# Patient Record
Sex: Female | Born: 1990 | Hispanic: No | Marital: Married | Smoking: Never smoker
Health system: Southern US, Community
[De-identification: ages and names within clinical notes are randomized; demographics above are authoritative.]

## PROBLEM LIST (undated history)

## (undated) ENCOUNTER — Inpatient Hospital Stay (HOSPITAL_COMMUNITY): Payer: Self-pay

## (undated) DIAGNOSIS — J45909 Unspecified asthma, uncomplicated: Secondary | ICD-10-CM

## (undated) HISTORY — DX: Unspecified asthma, uncomplicated: J45.909

---

## 2015-06-16 DIAGNOSIS — O321XX0 Maternal care for breech presentation, not applicable or unspecified: Secondary | ICD-10-CM

## 2017-09-27 NOTE — L&D Delivery Note (Signed)
Patient is 27 y.o. [id number] 4473w1d admitted for IOL for polyhydramnios. S/p IOL with foley bulb followed by Pitocin. AROM at 2256.  Prenatal course also complicated by h/o prior C/S for breech, polyhydramnios, bilateral UTD, hx of asthma.  Delivery Note At 1:57 AM a viable female was delivered via Vaginal, Spontaneous (Presentation: ROA ).  APGAR: 8, 9; weight  pending.   Placenta status: spontaneous, intact .  Cord:  3 vessel  Anesthesia:  Epidural  Episiotomy: None Lacerations: 1st degree;Periurethral Suture Repair: 3.0 vicryl, 4.0 monocryl Est. Blood Loss (mL):  600 cc   Head delivered ROA. No nuchal cord present. Shoulder and body delivered in usual fashion. Infant with spontaneous cry, placed on mother's abdomen, dried and bulb suctioned. Cord clamped x 2 after 1-minute delay, and cut by family member. Cord blood drawn. Placenta delivered spontaneously with gentle cord traction. Fundus firm with massage and Pitocin. Perineum inspected and found to have 1st degree and periurethral laceration, which was found to be hemostatic. Which was repaired with 3.0 vicryl and 4.0 monocryl with good hemostasis achieved.   Mom to postpartum.  Baby to Couplet care / Skin to Skin.  Oralia ManisSherin Kayn Haymore, DO PGY-1 10/18/2017, 2:41 AM

## 2017-10-11 ENCOUNTER — Other Ambulatory Visit: Payer: Self-pay

## 2017-10-11 ENCOUNTER — Encounter (HOSPITAL_COMMUNITY): Payer: Self-pay | Admitting: *Deleted

## 2017-10-11 ENCOUNTER — Inpatient Hospital Stay (HOSPITAL_COMMUNITY)
Admission: AD | Admit: 2017-10-11 | Discharge: 2017-10-11 | Disposition: A | Discharge status: Home / Self Care | Payer: Medicaid Other | Source: Ambulatory Visit | Attending: Obstetrics and Gynecology | Admitting: Obstetrics and Gynecology

## 2017-10-11 DIAGNOSIS — O26893 Other specified pregnancy related conditions, third trimester: Secondary | ICD-10-CM | POA: Diagnosis present

## 2017-10-11 DIAGNOSIS — Z3A38 38 weeks gestation of pregnancy: Secondary | ICD-10-CM | POA: Diagnosis not present

## 2017-10-11 DIAGNOSIS — O0933 Supervision of pregnancy with insufficient antenatal care, third trimester: Secondary | ICD-10-CM | POA: Diagnosis not present

## 2017-10-11 LAB — URINALYSIS, ROUTINE W REFLEX MICROSCOPIC
BILIRUBIN URINE: NEGATIVE
Glucose, UA: NEGATIVE mg/dL
HGB URINE DIPSTICK: NEGATIVE
Ketones, ur: NEGATIVE mg/dL
Nitrite: NEGATIVE
PH: 5 (ref 5.0–8.0)
Protein, ur: 30 mg/dL — AB
SPECIFIC GRAVITY, URINE: 1.027 (ref 1.005–1.030)

## 2017-10-11 NOTE — MAU Note (Signed)
Pt presents for a "check-up" , states she got her care in [place] and just got here one week ago. Reports they called here yesterday and were told to come today. Also reports cold symptoms

## 2017-10-11 NOTE — Discharge Instructions (Signed)
Safe Medications in Pregnancy   Acne: Benzoyl Peroxide Salicylic Acid  Backache/Headache: Tylenol: 2 regular strength every 4 hours OR              2 Extra strength every 6 hours  Colds/Coughs/Allergies: Benadryl (alcohol free) 25 mg every 6 hours as needed Breath right strips Claritin Cepacol throat lozenges Chloraseptic throat spray Cold-Eeze- up to three times per day Cough drops, alcohol free Flonase (by prescription only) Guaifenesin Mucinex Robitussin DM (plain only, alcohol free) Saline nasal spray/drops Sudafed (pseudoephedrine) & Actifed ** use only after [redacted] weeks gestation and if you do not have high blood pressure Tylenol Vicks Vaporub Zinc lozenges Zyrtec   Constipation: Colace Ducolax suppositories Fleet enema Glycerin suppositories Metamucil Milk of magnesia Miralax Senokot Smooth move tea  Diarrhea: Kaopectate Imodium A-D  *NO pepto Bismol  Hemorrhoids: Anusol Anusol HC Preparation H Tucks  Indigestion: Tums Maalox Mylanta Zantac  Pepcid  Insomnia: Benadryl (alcohol free) 25mg  every 6 hours as needed Tylenol PM Unisom, no Gelcaps  Leg Cramps: Tums MagGel  Nausea/Vomiting:  Bonine Dramamine Emetrol Ginger extract Sea bands Meclizine  Nausea medication to take during pregnancy:  Unisom (doxylamine succinate 25 mg tablets) Take one tablet daily at bedtime. If symptoms are not adequately controlled, the dose can be increased to a maximum recommended dose of two tablets daily (1/2 tablet in the morning, 1/2 tablet mid-afternoon and one at bedtime). Vitamin B6 100mg  tablets. Take one tablet twice a day (up to 200 mg per day).  Skin Rashes: Aveeno products Benadryl cream or 25mg  every 6 hours as needed Calamine Lotion 1% cortisone cream  Yeast infection: Gyne-lotrimin 7 Monistat 7   **If taking multiple medications, please check labels to avoid duplicating the same active ingredients **take medication as directed on  the label ** Do not exceed 4000 mg of tylenol in 24 hours **Do not take medications that contain aspirin or ibuprofen    Vaginal Birth After Cesarean Delivery Vaginal birth after cesarean delivery (VBAC) is giving birth vaginally after previously delivering a baby by a cesarean. In the past, if a woman had a cesarean delivery, all births afterward would be done by cesarean delivery. This is no longer true. It can be safe for the mother to try a vaginal delivery after having a cesarean delivery. It is important to discuss VBAC with your health care provider early in the pregnancy so you can understand the risks, benefits, and options. It will give you time to decide what is best in your particular case. The final decision about whether to have a VBAC or repeat cesarean delivery should be between you and your health care provider. Any changes in your health or your babys health during your pregnancy may make it necessary to change your initial decision about VBAC. Women who plan to have a VBAC should check with their health care provider to be sure that:  The previous cesarean delivery was done with a low transverse uterine cut (incision) (not a vertical classical incision).  The birth canal is big enough for the baby.  There were no other operations on the uterus.  An electronic fetal monitor (EFM) will be on at all times during labor.  An operating room will be available and ready in case an emergency cesarean delivery is needed.  A health care provider and surgical nursing staff will be available at all times during labor to be ready to do an emergency delivery cesarean if necessary.  An anesthesiologist will be present in  case an emergency cesarean delivery is needed.  The nursery is prepared and has adequate personnel and necessary equipment available to care for the baby in case of an emergency cesarean delivery. Benefits of VBAC  Shorter stay in the hospital.  Avoidance of risks  associated with cesarean delivery, such as: ? Surgical complications, such as opening of the incision or hernia in the incision. ? Injury to other organs. ? Fever. This can occur if an infection develops after surgery. It can also occur as a reaction to the medicine given to make you numb during the surgery.  Less blood loss and need for blood transfusions.  Lower risk of blood clots and infection.  Shorter recovery.  Decreased risk for having to remove the uterus (hysterectomy).  Decreased risk for the placenta to completely or partially cover the opening of the uterus (placenta previa) with a future pregnancy.  Decrease risk in future labor and delivery. Risks of a VBAC  Tearing (rupture) of the uterus. This is occurs in less than 1% of VBACs. The risk of this happening is higher if: ? Steps are taken to begin the labor process (induce labor) or stimulate or strengthen contractions (augment labor). ? Medicine is used to soften (ripen) the cervix.  Having to remove the uterus (hysterectomy) if it ruptures. VBAC should not be done if:  The previous cesarean delivery was done with a vertical (classical) or T-shaped incision or you do not know what kind of incision was made.  You had a ruptured uterus.  You have had certain types of surgery on your uterus, such as removal of uterine fibroids. Ask your health care provider about other types of surgeries that prevent you from having a VBAC.  You have certain medical or childbirth (obstetrical) problems.  There are problems with the baby.  You have had two previous cesarean deliveries and no vaginal deliveries. Other facts to know about VBAC:  It is safe to have an epidural anesthetic with VBAC.  It is safe to turn the baby from a breech position (attempt an external cephalic version).  It is safe to try a VBAC with twins.  VBAC may not be successful if your baby weights 8.8 lb (4 kg) or more. However, weight predictions are  not always accurate and should not be used alone to decide if VBAC is right for you.  There is an increased failure rate if the time between the cesarean delivery and VBAC is less than 19 months.  Your health care provider may advise against a VBAC if you have preeclampsia (high blood pressure, protein in the urine, and swelling of face and extremities).  VBAC is often successful if you previously gave birth vaginally.  VBAC is often successful when the labor starts spontaneously before the due date.  Delivering a baby through a VBAC is similar to having a normal spontaneous vaginal delivery. This information is not intended to replace advice given to you by your health care provider. Make sure you discuss any questions you have with your health care provider. Document Released: 03/06/2007 Document Revised: 02/19/2016 Document Reviewed: 04/12/2013 Elsevier Interactive Patient Education  [employer]2018 [employer] Inc[employer]

## 2017-10-11 NOTE — MAU Provider Note (Signed)
History     CSN: [id number]  Arrival date and time: 10/11/17 [id number]   First Provider Initiated Contact with Patient 10/11/17 1142      Chief Complaint  Patient presents with  . Prenatal visit   HPI Jacqueline Macdonald is a 27 y.o. G3P1011 at 7036w4d who presents requesting a "check up." She moved here from [place] 10 days ago and wants to establish prenatal care here. She denies any abdominal pain, vaginal bleeding or discharge. She reports good fetal movement. She denies any problems during this pregnancy and brought some records from her prenatal care with her. Her first pregnancy was delivered by c/s due to breech presentation and she desires a TOLAC.   OB History    Gravida Para Term Preterm AB Living   3 1 1   1 1    SAB TAB Ectopic Multiple Live Births     1     1      History reviewed. No pertinent past medical history.  Past Surgical History:  Procedure Laterality Date  . CESAREAN SECTION      History reviewed. No pertinent family history.  Social History   Tobacco Use  . Smoking status: Never Smoker  . Smokeless tobacco: Never Used  Substance Use Topics  . Alcohol use: No    Frequency: Never  . Drug use: No    Allergies: No Known Allergies  Medications Prior to Admission  Medication Sig Dispense Refill Last Dose  . IRON PO Take 1 tablet by mouth daily. Pt not sure of dosage and not sure info for pharmacy filled   10/11/2017 at Unknown time  . Prenatal Vit-Fe Fumarate-FA (PRENATAL MULTIVITAMIN) TABS tablet Take 1 tablet by mouth daily at 12 noon.   10/10/2017 at Unknown time    Review of Systems  Constitutional: Negative.  Negative for fatigue and fever.  HENT: Negative.   Respiratory: Negative.  Negative for shortness of breath.   Cardiovascular: Negative.  Negative for chest pain.  Gastrointestinal: Negative.  Negative for abdominal pain, constipation, diarrhea, nausea and vomiting.  Genitourinary: Negative.  Negative for dysuria, vaginal bleeding and vaginal  discharge.  Neurological: Negative.  Negative for dizziness and headaches.   Physical Exam   Blood pressure 128/82, pulse 98, temperature 98.1 F (36.7 C), temperature source Oral, resp. rate 16, height 5\' 3"  (1.6 m), weight 208 lb (94.3 kg), SpO2 98 %.  Physical Exam  Nursing note and vitals reviewed. Constitutional: She is oriented to person, place, and time. She appears well-developed and well-nourished. No distress.  HENT:  Head: Normocephalic.  Eyes: Pupils are equal, round, and reactive to light.  Cardiovascular: Normal rate, regular rhythm and normal heart sounds.  Respiratory: Effort normal and breath sounds normal. No respiratory distress.  GI: Soft. Bowel sounds are normal. She exhibits no distension. There is no tenderness.  Neurological: She is alert and oriented to person, place, and time.  Skin: Skin is warm and dry.  Psychiatric: She has a normal mood and affect. Her behavior is normal. Judgment and thought content normal.   Fetal Tracing:  Baseline: 140 Variability: moderate Accels: 15x15 Decels: none  Toco: occasional uc's   MAU Course  Procedures Results for orders placed or performed during the hospital encounter of 10/11/17 (from the past 24 hour(s))  Urinalysis, Routine w reflex microscopic     Status: Abnormal   Collection Time: 10/11/17 10:26 AM  Result Value Ref Range   Color, Urine YELLOW YELLOW   APPearance CLOUDY (A) CLEAR  Specific Gravity, Urine 1.027 1.005 - 1.030   pH 5.0 5.0 - 8.0   Glucose, UA NEGATIVE NEGATIVE mg/dL   Hgb urine dipstick NEGATIVE NEGATIVE   Bilirubin Urine NEGATIVE NEGATIVE   Ketones, ur NEGATIVE NEGATIVE mg/dL   Protein, ur 30 (A) NEGATIVE mg/dL   Nitrite NEGATIVE NEGATIVE   Leukocytes, UA MODERATE (A) NEGATIVE   RBC / HPF 0-5 0 - 5 RBC/hpf   WBC, UA 6-30 0 - 5 WBC/hpf   Bacteria, UA MANY (A) NONE SEEN   Squamous Epithelial / LPF TOO NUMEROUS TO COUNT (A) NONE SEEN   Mucus PRESENT    Non Squamous Epithelial 0-5  (A) NONE SEEN   MDM UA NST Vertex presentation confirmed by bedside u/s by L. Leftwich-Kirby CNM Assessment and Plan   1. [redacted] weeks gestation of pregnancy   2. Late prenatal care affecting pregnancy in third trimester    -Discharge home in stable condition -Labor precautions discussed -Patient advised to follow-up with [facility] tomorrow to establish prenatal care -Patient may return to MAU as needed or if her condition were to change or worsen  Rolm Bookbinder CNM 10/11/2017, 12:12 PM

## 2017-10-12 ENCOUNTER — Ambulatory Visit (INDEPENDENT_AMBULATORY_CARE_PROVIDER_SITE_OTHER): Payer: Medicaid Other | Admitting: Obstetrics and Gynecology

## 2017-10-12 ENCOUNTER — Encounter: Payer: Self-pay | Admitting: Obstetrics and Gynecology

## 2017-10-12 VITALS — BP 132/68 | HR 86 | Wt 205.0 lb

## 2017-10-12 DIAGNOSIS — Z113 Encounter for screening for infections with a predominantly sexual mode of transmission: Secondary | ICD-10-CM

## 2017-10-12 DIAGNOSIS — Z98891 History of uterine scar from previous surgery: Secondary | ICD-10-CM | POA: Diagnosis not present

## 2017-10-12 DIAGNOSIS — J45909 Unspecified asthma, uncomplicated: Secondary | ICD-10-CM

## 2017-10-12 DIAGNOSIS — Z789 Other specified health status: Secondary | ICD-10-CM

## 2017-10-12 DIAGNOSIS — Z3403 Encounter for supervision of normal first pregnancy, third trimester: Secondary | ICD-10-CM | POA: Diagnosis present

## 2017-10-12 DIAGNOSIS — Z758 Other problems related to medical facilities and other health care: Secondary | ICD-10-CM | POA: Insufficient documentation

## 2017-10-12 DIAGNOSIS — Z603 Acculturation difficulty: Secondary | ICD-10-CM

## 2017-10-12 LAB — POCT URINALYSIS DIP (DEVICE)
Bilirubin Urine: NEGATIVE
Glucose, UA: NEGATIVE mg/dL
Nitrite: NEGATIVE
PH: 6 (ref 5.0–8.0)
PROTEIN: NEGATIVE mg/dL
SPECIFIC GRAVITY, URINE: 1.02 (ref 1.005–1.030)
Urobilinogen, UA: 0.2 mg/dL (ref 0.0–1.0)

## 2017-10-12 LAB — OB RESULTS CONSOLE GBS: GBS: NEGATIVE

## 2017-10-12 NOTE — Progress Notes (Signed)
Presentation (vertex) confirmed by bedside US today.

## 2017-10-12 NOTE — Progress Notes (Signed)
INITIAL PRENATAL VISIT NOTE  Subjective:  Jacqueline Macdonald is a 27 y.o. G3P1011 at [redacted]w[redacted]d by 27 week [place] being seen today for her initial prenatal visit. They arrived from [place] this week and she brought a couple of ultrasounds with her. She and partner are happy with the pregnancy. She has an obstetric history significant for CS for breech position, reports bleeding and partial separation of placenta at 6 months with full term delivery. She has a medical history significant for asthma.   Patient reports occasional contractions.  Contractions: Irritability. Vag. Bleeding: None.  Movement: Present. Denies leaking of fluid.   Past Medical History:  Diagnosis Date  . Asthma    Past Surgical History:  Procedure Laterality Date  . CESAREAN SECTION     OB History  Gravida Para Term Preterm AB Living  3 1 1   1 1   SAB TAB Ectopic Multiple Live Births    1     1    # Outcome Date GA Lbr Len/2nd Weight Sex Delivery Anes PTL Lv  3 Current           2 Term 06/16/15 [redacted]w[redacted]d   F CS-Unspec   LIV     Complications: Breech presentation  1 TAB              Social History   Socioeconomic History  . Marital status: Married    Spouse name: None  . Number of children: None  . Years of education: None  . Highest education level: None  Social Needs  . Financial resource strain: None  . Food insecurity - worry: None  . Food insecurity - inability: None  . Transportation needs - medical: None  . Transportation needs - non-medical: None  Occupational History  . None  Tobacco Use  . Smoking status: Never Smoker  . Smokeless tobacco: Never Used  Substance and Sexual Activity  . Alcohol use: No    Frequency: Never  . Drug use: No  . Sexual activity: Yes  Other Topics Concern  . None  Social History Narrative  . None   History reviewed. No pertinent family history.  (Not in a hospital admission)  No Known Allergies  Review of Systems: Negative except for what is mentioned in  HPI.  Objective:   Vitals:   10/12/17 1458  BP: 132/68  Pulse: 86  Weight: 205 lb (93 kg)   Fetal Status: Fetal Heart Rate (bpm): 154   Movement: Present     Physical Exam: BP 132/68   Pulse 86   Wt 205 lb (93 kg)   BMI 36.31 kg/m  CONSTITUTIONAL: Well-developed, well-nourished female in no acute distress.  NEUROLOGIC: Alert and oriented to person, place, and time. Normal reflexes, muscle tone coordination. No cranial nerve deficit noted. PSYCHIATRIC: Normal mood and affect. Normal behavior. Normal judgment and thought content. SKIN: Skin is warm and dry. No rash noted. Not diaphoretic. No erythema. No pallor. HENT:  Normocephalic, atraumatic, External right and left ear normal. Oropharynx is clear and moist EYES: Conjunctivae and EOM are normal. Pupils are equal, round, and reactive to light. No scleral icterus.  NECK: Normal range of motion, supple, no masses CARDIOVASCULAR: Normal heart rate noted, regular rhythm RESPIRATORY: Effort and breath sounds normal, no problems with respiration noted ABDOMEN: Soft, nontender, nondistended, gravid. 38 cm fundus GU: normal appearing external female genitalia, multiparous normal appearing cervix, scant white discharge in vagina, no lesions noted MUSCULOSKELETAL: Normal range of motion. EXT:  No edema and  no tenderness. 2+ distal pulses.  US: cephalic  Assessment and Plan:  Pregnancy: G3P1011 at 7760w2d by 27 week US  1. Encounter for supervision of normal first pregnancy in third trimester Anatomy US ordered  2. H/O: C-section PCS for breech position Reviewed risks/benefits of TOLAC versus RCS in detail. Patient counseled regarding potential vaginal delivery, chance of success, future implications, possible uterine rupture and need for urgent/emergent repeat cesarean. Counseled regarding potential need for repeat c-section for reasons unrelated to first c-section. Counseled regarding scheduled repeat cesarean including risks of  bleeding, infection, damage to surrounding tissue, abnormal placentation, implications for future pregnancies. All questions answered.  Patient desires TOLAC, consent signed 10/12/2017.  3. Language barrier Arabic speaking, interpretor used  4. Uncomplicated asthma, unspecified asthma severity, unspecified whether persistent two attacks in pregnancy   Term labor symptoms and general obstetric precautions including but not limited to vaginal bleeding, contractions, leaking of fluid and fetal movement were reviewed in detail with the patient.  Please refer to After Visit Summary for other counseling recommendations.   Return in about 1 week (around 10/19/2017) for OB visit.  Conan BowensKelly M Davis 10/12/2017 5:16 PM

## 2017-10-13 ENCOUNTER — Telehealth: Payer: Self-pay | Admitting: *Deleted

## 2017-10-13 LAB — CBC
Hematocrit: 37.3 % (ref 34.0–46.6)
Hemoglobin: 12.3 g/dL (ref 11.1–15.9)
MCH: 30.8 pg (ref 26.6–33.0)
MCHC: 33 g/dL (ref 31.5–35.7)
MCV: 93 fL (ref 79–97)
PLATELETS: 296 10*3/uL (ref 150–379)
RBC: 4 x10E6/uL (ref 3.77–5.28)
RDW: 14 % (ref 12.3–15.4)
WBC: 7.1 10*3/uL (ref 3.4–10.8)

## 2017-10-13 LAB — HEMOGLOBIN A1C
ESTIMATED AVERAGE GLUCOSE: 97 mg/dL
Hgb A1c MFr Bld: 5 % (ref 4.8–5.6)

## 2017-10-13 LAB — CERVICOVAGINAL ANCILLARY ONLY
Bacterial vaginitis: NEGATIVE
CHLAMYDIA, DNA PROBE: NEGATIVE
Candida vaginitis: NEGATIVE
NEISSERIA GONORRHEA: NEGATIVE
TRICH (WINDOWPATH): NEGATIVE

## 2017-10-13 LAB — HEPATITIS C ANTIBODY: Hep C Virus Ab: <0.1 s/co ratio (ref 0.0–0.9)

## 2017-10-13 LAB — RPR: RPR Ser Ql: NONREACTIVE

## 2017-10-13 LAB — HEPATITIS B SURFACE ANTIGEN: HEP B S AG: NEGATIVE

## 2017-10-13 LAB — RUBELLA SCREEN: RUBELLA: 11.9 {index} (ref >0.99)

## 2017-10-13 LAB — HIV ANTIBODY (ROUTINE TESTING W REFLEX): HIV SCREEN 4TH GENERATION: NONREACTIVE

## 2017-10-13 NOTE — Telephone Encounter (Signed)
Called pt w/[employer] interpreter [phone] and informed her of need for US per recommendation of the doctor.  Pt was then advised of appt details.  She voiced understanding and agreed to the appt as stated.

## 2017-10-13 NOTE — Addendum Note (Signed)
Addended by: Jill SideAY, Jora Galluzzo L on: 10/13/2017 01:18 PM   Modules accepted: Orders

## 2017-10-14 ENCOUNTER — Other Ambulatory Visit: Payer: Self-pay | Admitting: Obstetrics and Gynecology

## 2017-10-14 ENCOUNTER — Ambulatory Visit (HOSPITAL_COMMUNITY)
Admission: RE | Admit: 2017-10-14 | Discharge: 2017-10-14 | Disposition: A | Discharge status: Home / Self Care | Payer: Medicaid Other | Source: Ambulatory Visit | Attending: Obstetrics and Gynecology | Admitting: Obstetrics and Gynecology

## 2017-10-14 DIAGNOSIS — O34219 Maternal care for unspecified type scar from previous cesarean delivery: Secondary | ICD-10-CM

## 2017-10-14 DIAGNOSIS — Z98891 History of uterine scar from previous surgery: Secondary | ICD-10-CM

## 2017-10-14 DIAGNOSIS — Z3689 Encounter for other specified antenatal screening: Secondary | ICD-10-CM

## 2017-10-14 DIAGNOSIS — O403XX0 Polyhydramnios, third trimester, not applicable or unspecified: Secondary | ICD-10-CM | POA: Diagnosis present

## 2017-10-14 DIAGNOSIS — O359XX0 Maternal care for (suspected) fetal abnormality and damage, unspecified, not applicable or unspecified: Secondary | ICD-10-CM | POA: Insufficient documentation

## 2017-10-14 DIAGNOSIS — Z3A38 38 weeks gestation of pregnancy: Secondary | ICD-10-CM | POA: Insufficient documentation

## 2017-10-14 DIAGNOSIS — Z3403 Encounter for supervision of normal first pregnancy, third trimester: Secondary | ICD-10-CM

## 2017-10-15 ENCOUNTER — Telehealth: Payer: Self-pay | Admitting: Obstetrics and Gynecology

## 2017-10-15 NOTE — Telephone Encounter (Signed)
Called to inform patient of IOL scheduled for Monday, 10/17/17 @ 7:30 am. Patient previously counseled regarding TOLAC and was agreeable, however, now she is unsure. Would like to discuss with her husband. Reviewed that it is recommended she be delivered early this week and we will contact her tomorrow regarding her plan. She verbalizes understanding and will present to MAU with any issues.   Baldemar LenisK. Meryl Davis, M.D. Attending Obstetrician & Gynecologist, [facility], [facility]

## 2017-10-16 LAB — CULTURE, BETA STREP (GROUP B ONLY): STREP GP B CULTURE: NEGATIVE

## 2017-10-17 ENCOUNTER — Other Ambulatory Visit: Payer: Self-pay

## 2017-10-17 ENCOUNTER — Encounter (HOSPITAL_COMMUNITY): Payer: Self-pay

## 2017-10-17 ENCOUNTER — Inpatient Hospital Stay (HOSPITAL_COMMUNITY): Payer: Medicaid Other | Admitting: Anesthesiology

## 2017-10-17 ENCOUNTER — Inpatient Hospital Stay (HOSPITAL_COMMUNITY)
Admission: RE | Admit: 2017-10-17 | Discharge: 2017-10-20 | DRG: 807 | Disposition: A | Discharge status: Home / Self Care | Payer: Medicaid Other | Source: Ambulatory Visit | Attending: Obstetrics and Gynecology | Admitting: Obstetrics and Gynecology

## 2017-10-17 DIAGNOSIS — Z3403 Encounter for supervision of normal first pregnancy, third trimester: Secondary | ICD-10-CM

## 2017-10-17 DIAGNOSIS — Z98891 History of uterine scar from previous surgery: Secondary | ICD-10-CM

## 2017-10-17 DIAGNOSIS — Z3A39 39 weeks gestation of pregnancy: Secondary | ICD-10-CM | POA: Diagnosis not present

## 2017-10-17 DIAGNOSIS — O34219 Maternal care for unspecified type scar from previous cesarean delivery: Secondary | ICD-10-CM | POA: Diagnosis present

## 2017-10-17 DIAGNOSIS — Z789 Other specified health status: Secondary | ICD-10-CM

## 2017-10-17 DIAGNOSIS — O403XX0 Polyhydramnios, third trimester, not applicable or unspecified: Secondary | ICD-10-CM | POA: Diagnosis present

## 2017-10-17 DIAGNOSIS — O409XX0 Polyhydramnios, unspecified trimester, not applicable or unspecified: Secondary | ICD-10-CM | POA: Diagnosis present

## 2017-10-17 LAB — CBC
HCT: 36.2 % (ref 36.0–46.0)
HEMATOCRIT: 39.3 % (ref 36.0–46.0)
HEMOGLOBIN: 13.3 g/dL (ref 12.0–15.0)
Hemoglobin: 12.3 g/dL (ref 12.0–15.0)
MCH: 31 pg (ref 26.0–34.0)
MCH: 31.5 pg (ref 26.0–34.0)
MCHC: 33.8 g/dL (ref 30.0–36.0)
MCHC: 34 g/dL (ref 30.0–36.0)
MCV: 91.6 fL (ref 78.0–100.0)
MCV: 92.6 fL (ref 78.0–100.0)
Platelets: 272 10*3/uL (ref 150–400)
Platelets: 246 10*3/uL (ref 150–400)
RBC: 4.29 MIL/uL (ref 3.87–5.11)
RBC: 3.91 MIL/uL (ref 3.87–5.11)
RDW: 14.4 % (ref 11.5–15.5)
RDW: 14.4 % (ref 11.5–15.5)
WBC: 8.2 10*3/uL (ref 4.0–10.5)
WBC: 10 10*3/uL (ref 4.0–10.5)

## 2017-10-17 LAB — COMPREHENSIVE METABOLIC PANEL
ALBUMIN: 2.7 g/dL — AB (ref 3.5–5.0)
ALT: 14 U/L (ref 14–54)
AST: 23 U/L (ref 15–41)
Alkaline Phosphatase: 187 U/L — ABNORMAL HIGH (ref 38–126)
Anion gap: 11 (ref 5–15)
BUN: 8 mg/dL (ref 6–20)
CHLORIDE: 103 mmol/L (ref 101–111)
CO2: 20 mmol/L — ABNORMAL LOW (ref 22–32)
Calcium: 8.9 mg/dL (ref 8.9–10.3)
Creatinine, Ser: 0.47 mg/dL (ref 0.44–1.00)
GFR calc Af Amer: >60 mL/min (ref >60)
GFR calc non Af Amer: >60 mL/min (ref >60)
Glucose, Bld: 66 mg/dL (ref 65–99)
POTASSIUM: 4 mmol/L (ref 3.5–5.1)
Sodium: 134 mmol/L — ABNORMAL LOW (ref 135–145)
Total Bilirubin: 1.1 mg/dL (ref 0.3–1.2)
Total Protein: 7.1 g/dL (ref 6.5–8.1)

## 2017-10-17 LAB — RPR: RPR Ser Ql: NONREACTIVE

## 2017-10-17 LAB — TYPE AND SCREEN
ABO/RH(D): O POS
Antibody Screen: NEGATIVE

## 2017-10-17 LAB — ABO/RH: ABO/RH(D): O POS

## 2017-10-17 LAB — PROTEIN / CREATININE RATIO, URINE
CREATININE, URINE: 169 mg/dL
Protein Creatinine Ratio: 0.17 mg/mg{Cre} — ABNORMAL HIGH (ref 0.00–0.15)
Total Protein, Urine: 28 mg/dL

## 2017-10-17 MED ORDER — LACTATED RINGERS IV SOLN: 500.0000 mL | Freq: Once | INTRAVENOUS | Status: DC

## 2017-10-17 MED ORDER — OXYCODONE-ACETAMINOPHEN 5-325 MG PO TABS: 1.0000 | ORAL_TABLET | ORAL | Status: DC | PRN

## 2017-10-17 MED ORDER — EPHEDRINE 5 MG/ML INJ
10.0000 mg | INTRAVENOUS | Status: DC | PRN
  Filled 2017-10-17: qty 2

## 2017-10-17 MED ORDER — LIDOCAINE HCL (PF) 1 % IJ SOLN
30.0000 mL | INTRAMUSCULAR | Status: AC | PRN
  Administered 2017-10-18: 30 mL via SUBCUTANEOUS
  Filled 2017-10-17: qty 30

## 2017-10-17 MED ORDER — LIDOCAINE HCL (PF) 1 % IJ SOLN
INTRAMUSCULAR | Status: DC | PRN
  Administered 2017-10-17 (×2): 4 mL

## 2017-10-17 MED ORDER — OXYTOCIN 40 UNITS IN LACTATED RINGERS INFUSION - SIMPLE MED
1.0000 m[IU]/min | INTRAVENOUS | Status: DC
  Administered 2017-10-17: 6 m[IU]/min via INTRAVENOUS

## 2017-10-17 MED ORDER — FENTANYL 2.5 MCG/ML BUPIVACAINE 1/10 % EPIDURAL INFUSION (WH - ANES): 14.0000 mL/h | INTRAMUSCULAR | Status: DC | PRN

## 2017-10-17 MED ORDER — FENTANYL 2.5 MCG/ML BUPIVACAINE 1/10 % EPIDURAL INFUSION (WH - ANES)
14.0000 mL/h | INTRAMUSCULAR | Status: DC | PRN
  Administered 2017-10-17 (×2): 14 mL/h via EPIDURAL
  Filled 2017-10-17 (×2): qty 100

## 2017-10-17 MED ORDER — PHENYLEPHRINE 40 MCG/ML (10ML) SYRINGE FOR IV PUSH (FOR BLOOD PRESSURE SUPPORT)
80.0000 ug | PREFILLED_SYRINGE | INTRAVENOUS | Status: DC | PRN
  Filled 2017-10-17: qty 10
  Filled 2017-10-17: qty 5

## 2017-10-17 MED ORDER — OXYTOCIN 40 UNITS IN LACTATED RINGERS INFUSION - SIMPLE MED: 2.5000 [IU]/h | INTRAVENOUS | Status: DC

## 2017-10-17 MED ORDER — OXYTOCIN 40 UNITS IN LACTATED RINGERS INFUSION - SIMPLE MED
1.0000 m[IU]/min | INTRAVENOUS | Status: DC
  Administered 2017-10-17: 1 m[IU]/min via INTRAVENOUS
  Filled 2017-10-17: qty 1000

## 2017-10-17 MED ORDER — LACTATED RINGERS IV SOLN: 500.0000 mL | INTRAVENOUS | Status: DC | PRN

## 2017-10-17 MED ORDER — TERBUTALINE SULFATE 1 MG/ML IJ SOLN
0.2500 mg | Freq: Once | INTRAMUSCULAR | Status: DC | PRN
  Filled 2017-10-17: qty 1

## 2017-10-17 MED ORDER — OXYTOCIN BOLUS FROM INFUSION
500.0000 mL | Freq: Once | INTRAVENOUS | Status: AC
  Administered 2017-10-18: 500 mL via INTRAVENOUS

## 2017-10-17 MED ORDER — DIPHENHYDRAMINE HCL 50 MG/ML IJ SOLN: 12.5000 mg | INTRAMUSCULAR | Status: DC | PRN

## 2017-10-17 MED ORDER — FENTANYL CITRATE (PF) 100 MCG/2ML IJ SOLN
100.0000 ug | INTRAMUSCULAR | Status: DC | PRN
  Administered 2017-10-17: 100 ug via INTRAVENOUS
  Filled 2017-10-17 (×2): qty 2

## 2017-10-17 MED ORDER — LACTATED RINGERS IV SOLN
INTRAVENOUS | Status: DC
  Administered 2017-10-17 (×2): via INTRAVENOUS

## 2017-10-17 MED ORDER — PHENYLEPHRINE 40 MCG/ML (10ML) SYRINGE FOR IV PUSH (FOR BLOOD PRESSURE SUPPORT)
80.0000 ug | PREFILLED_SYRINGE | INTRAVENOUS | Status: DC | PRN
  Filled 2017-10-17: qty 5

## 2017-10-17 MED ORDER — SOD CITRATE-CITRIC ACID 500-334 MG/5ML PO SOLN: 30.0000 mL | ORAL | Status: DC | PRN

## 2017-10-17 MED ORDER — ONDANSETRON HCL 4 MG/2ML IJ SOLN: 4.0000 mg | Freq: Four times a day (QID) | INTRAMUSCULAR | Status: DC | PRN

## 2017-10-17 MED ORDER — ACETAMINOPHEN 325 MG PO TABS: 650.0000 mg | ORAL_TABLET | ORAL | Status: DC | PRN

## 2017-10-17 MED ORDER — FLEET ENEMA 7-19 GM/118ML RE ENEM: 1.0000 | ENEMA | RECTAL | Status: DC | PRN

## 2017-10-17 MED ORDER — OXYCODONE-ACETAMINOPHEN 5-325 MG PO TABS: 2.0000 | ORAL_TABLET | ORAL | Status: DC | PRN

## 2017-10-17 NOTE — Anesthesia Pain Management Evaluation Note (Signed)
  CRNA Pain Management Visit Note  Patient: Jacqueline Macdonald, 27 y.o., female  "Hello I am a member of the anesthesia team at [facility]. We have an anesthesia team available at all times to provide care throughout the hospital, including epidural management and anesthesia for C-section. I don't know your plan for the delivery whether it a natural birth, water birth, IV sedation, nitrous supplementation, doula or epidural, but we want to meet your pain goals."   1.Was your pain managed to your expectations on prior hospitalizations?   Yes   2.What is your expectation for pain management during this hospitalization?     Epidural  3.How can we help you reach that goal? epidural  Record the patient's initial score and the patient's pain goal.   Pain: 5/10  Pain Goal: 0/10 [facility] wants you to be able to say your pain was always managed very well.  Salome ArntSterling, Ledger Heindl Marie 10/17/2017

## 2017-10-17 NOTE — Progress Notes (Signed)
Jacqueline Macdonald is a 27 y.o. G3P1011 at [redacted]w[redacted]d  admitted for IOL for polyhydramnios  Subjective: Patient doing well. No questions or concerns.   Objective: BP 111/66   Pulse 73   Temp 98.1 F (36.7 C) (Oral)   Resp 18   Ht 5\' 3"  (1.6 m)   Wt 94.3 kg (208 lb)   BMI 36.85 kg/m  No intake/output data recorded. Total I/O In: -  Out: 350 [Urine:350]  FHT:  FHR: 130 bpm, variability: moderate,  accelerations:  Present,  decelerations:  Absent UC:   regular, every 1-3 minutes SVE:   Dilation: 3.5 Effacement (%): 70 Station: Ballotable Exam by:: Dr Nira Retortegele  Labs: Lab Results  Component Value Date   WBC 10.0 10/17/2017   HGB 12.3 10/17/2017   HCT 36.2 10/17/2017   MCV 92.6 10/17/2017   PLT 246 10/17/2017    Assessment / Plan: Induction of labor due to polyhydramnios,  progressing well on pitocin  Labor: s/p FB. On Pitocin. Can consider AROM but was ballotable on last cervicle exam Preeclampsia:  Pr:Cr ratio of 0.17, all other labs stable Fetal Wellbeing:  Category I Pain Control:  Epidural I/D:  n/a Anticipated MOD:  NSVD  [name] 10/17/2017, 10:18 PM

## 2017-10-17 NOTE — H&P (Addendum)
LABOR AND DELIVERY ADMISSION HISTORY AND PHYSICAL NOTE  Jacqueline Macdonald is a 27 y.o. female G3P1011 with IUP at 10692w0d by 27-wk U/S presenting for IOL for polyhydramnios.  She reports positive fetal movement. She denies leakage of fluid or vaginal bleeding.  Prenatal History/Complications: PNC in [place]; [facility] 38 weeks Pregnancy complications:  - Hx of prior C/S for breech - Polyhydramnios (AFI 27) - Bilateral UTD - Hx of asthma  Past Medical History: Past Medical History:  Diagnosis Date  . Asthma     Past Surgical History: Past Surgical History:  Procedure Laterality Date  . CESAREAN SECTION      Obstetrical History: OB History    Gravida Para Term Preterm AB Living   3 1 1   1 1    SAB TAB Ectopic Multiple Live Births     1     1      Social History: Social History   Socioeconomic History  . Marital status: Married    Spouse name: None  . Number of children: None  . Years of education: None  . Highest education level: None  Social Needs  . Financial resource strain: None  . Food insecurity - worry: None  . Food insecurity - inability: None  . Transportation needs - medical: None  . Transportation needs - non-medical: None  Occupational History  . None  Tobacco Use  . Smoking status: Never Smoker  . Smokeless tobacco: Never Used  Substance and Sexual Activity  . Alcohol use: No    Frequency: Never  . Drug use: No  . Sexual activity: Yes  Other Topics Concern  . None  Social History Narrative  . None    Family History: History reviewed. No pertinent family history.  Allergies: No Known Allergies  Medications Prior to Admission  Medication Sig Dispense Refill Last Dose  . IRON PO Take 1 tablet by mouth daily. Pt not sure of dosage and not sure info for pharmacy filled   Taking  . Prenatal Vit-Fe Fumarate-FA (PRENATAL MULTIVITAMIN) TABS tablet Take 1 tablet by mouth daily at 12 noon.   Taking     Review of Systems  All systems  reviewed and negative except as stated in HPI  Physical Exam Blood pressure 127/83, pulse 87, temperature 97.7 F (36.5 C), temperature source Oral, resp. rate 16, height 5\' 3"  (1.6 m), weight 208 lb (94.3 kg). General appearance: alert, oriented, NAD Lungs: normal respiratory effort Heart: regular rate Abdomen: soft, non-tender; gravid, FH appropriate for GA Extremities: No calf swelling or tenderness Presentation: cephalic Fetal monitoring: baseline rate 145, mod variability, +acel, no decel Uterine activity: irregular ctx    Prenatal labs: ABO, Rh: --/--/O POS (01/21 0825) Antibody: NEG (01/21 0825) Rubella: 11.90 (01/16 1622) RPR: Non Reactive (01/16 1622)  HBsAg: Negative (01/16 1622)  HIV: Non Reactive (01/16 1622)  GC/Chlamydia: negative GBS: Negative (01/16 0000)  2-hr GTT: N/A; Hgb A1c 5.0% on 10/12/17 Genetic screening:  N/A Anatomy US: U/S at 3350w2d showing bilateral UTD (7-749mm); per MFM, recommend postnatal renal ultrasound and pediatric urology evaluation if UTD persists.  Given gestational age, genetic testing if desired by pediatrics by exam may be most appropriate. EFW 89%ile (3670  gm, 8 lb 1 oz)  Prenatal Transfer Tool  Maternal Diabetes: No; per per MFM's note (see 10/14/17 U/S), concern for occult GDM; A1C was 5.0% Genetic Screening: not done Maternal Ultrasounds/Referrals: Abnormal:  Findings:   Fetal Kidney Anomalies as above Fetal Ultrasounds or other Referrals:  None Maternal Substance Abuse:  No Significant Maternal Medications:  None Significant Maternal Lab Results: Lab values include: Group B Strep negative  Results for orders placed or performed during the hospital encounter of 10/17/17 (from the past 24 hour(s))  CBC   Collection Time: 10/17/17  8:27 AM  Result Value Ref Range   WBC 8.2 4.0 - 10.5 K/uL   RBC 4.29 3.87 - 5.11 MIL/uL   Hemoglobin 13.3 12.0 - 15.0 g/dL   HCT 69.6 29.5 - 28.4 %   MCV 91.6 78.0 - 100.0 fL   MCH 31.0 26.0 - 34.0 pg    MCHC 33.8 30.0 - 36.0 g/dL   RDW 13.2 44.0 - 10.2 %   Platelets 272 150 - 400 K/uL    Patient Active Problem List   Diagnosis Date Noted  . Polyhydramnios 10/17/2017  . Encounter for supervision of normal first pregnancy in third trimester 10/12/2017  . H/O: C-section 10/12/2017  . Language barrier 10/12/2017  . Asthma     Assessment: Jacqueline Macdonald is a 27 y.o. G3P1011 at [redacted]w[redacted]d here for IOL for polyhydramnios. Pregnancy also complicated by late transfer of care (from another country) w/ limited availability of prenatal records, U/S at 38 weeks showing fetal UTD bilaterally, and EFW at 89%ile and AC at 93%ile, and hx of prior C/S  #Labor: TOLAC consent signed; FB placed, will start IV Pit #Pain: IV pain meds for now; planning on getting epidural #FWB: Cat I #ID:  GBS neg  Jacqueline Macdonald 10/17/2017, 9:35 AM   Attestation of Attending Supervision of Advanced Practitioner (CNM/NP): Evaluation and management procedures were performed by the Advanced Practitioner under my supervision and collaboration.  I have reviewed the Advanced Practitioner's note and chart, and I agree with the management and plan.  Jacqueline Macdonald 10/17/2017 11:08 AM

## 2017-10-17 NOTE — Progress Notes (Signed)
Plan of care discussed with pt by Dr. Nira Retortegele with [employer] Arabic [employer]pre[employer], Amal # [phone]. All questions answered. VBAC consent signed. Pt requesting pain medication at bedside during placement of foley balloon.

## 2017-10-17 NOTE — Anesthesia Procedure Notes (Signed)
Epidural Patient location during procedure: OB  Staffing Anesthesiologist: Contessa Preuss, MD Performed: anesthesiologist   Preanesthetic Checklist Completed: patient identified, pre-op evaluation, timeout performed, IV checked, risks and benefits discussed and monitors and equipment checked  Epidural Patient position: sitting Prep: site prepped and draped and DuraPrep Patient monitoring: heart rate, continuous pulse ox and blood pressure Approach: midline Location: L3-L4 Injection technique: LOR air and LOR saline  Needle:  Needle type: Tuohy  Needle gauge: 17 G Needle length: 9 cm Needle insertion depth: 7 cm Catheter type: closed end flexible Catheter size: 19 Gauge Catheter at skin depth: 12 cm Test dose: negative  Assessment Sensory level: T8 Events: blood not aspirated, injection not painful, no injection resistance, negative IV test and no paresthesia  Additional Notes Reason for block:procedure for pain     

## 2017-10-17 NOTE — Progress Notes (Addendum)
LABOR PROGRESS NOTE  Jacqueline Macdonald is a 27 y.o. G3P1011 at 4065w0d  admitted for IOL for polyhydramnios  Subjective: Patient doing well, pain well-controlled with epidural. Asking about progress and worried she may need C/S  Objective: BP (!) 142/70   Pulse 85   Temp 98.1 F (36.7 C) (Oral)   Resp 18   Ht 5\' 3"  (1.6 m)   Wt 208 lb (94.3 kg)   BMI 36.85 kg/m  or  Vitals:   10/17/17 1831 10/17/17 1901 10/17/17 1925 10/17/17 1931  BP: (!) 146/88 140/83  (!) 142/70  Pulse: 95 99  85  Resp:    18  Temp:   98.1 F (36.7 C)   TempSrc:   Oral   Weight:      Height:        SVE: Dilation: 3.5 Effacement (%): 70 Station: Ballotable Presentation: Vertex Exam by:: Dr Nira Retortegele FHT: baseline rate 135, moderate varibility, +acel, no decel Toco: ctx q 2-4 min  Assessment / Plan: 27 y.o. G3P1011 at 7665w0d here for IOL for polyhydramnios. Pregnancy also complicated by late transfer of care (from another country) w/ limited availability of prenatal records, U/S at 38 weeks showing fetal UTD bilaterally, and EFW at 89%ile and AC at 93%ile, and hx of prior C/S  Labor: TOLAC; s/p FB. Continue to titrate IV Pitocin, still not in labor. May need to AROM, but remain ballotable. Fetal Wellbeing:  Cat I Pain Control:  Epidural Anticipated MOD:  Hopeful for SVD  Elevated BPs: last few hours systolic BPs >140s  --> GHTN. Will check CBC, CMP, UPC  Frederik PearJulie P Arcenio Mullaly, MD 10/17/2017, 7:48 PM

## 2017-10-17 NOTE — Progress Notes (Signed)
LABOR PROGRESS NOTE  Jacqueline Macdonald is a 27 y.o. G3P1011 at 2889w0d  admitted for IOL for polyhydramnios  Subjective: Patient doing well, pain well-controlled with epidural  Objective: BP (!) 141/87   Pulse 93   Temp 98.2 F (36.8 C)   Resp 16   Ht 5\' 3"  (1.6 m)   Wt 208 lb (94.3 kg)   BMI 36.85 kg/m  or  Vitals:   10/17/17 1516 10/17/17 1521 10/17/17 1531 10/17/17 1601  BP: (!) 130/91 133/89 134/85 (!) 141/87  Pulse: 94 89 88 93  Resp:    16  Temp:    98.2 F (36.8 C)  TempSrc:      Weight:      Height:        SVE: Dilation: 3.5 Effacement (%): 70 Station: Ballotable Presentation: Vertex Exam by:: Johnnye Sandford FHT: baseline rate 140, moderate varibility, +acel, no decel Toco: ctx q 1-3 min  Assessment / Plan: 27 y.o. G3P1011 at 3189w0d here for IOL for polyhydramnios. Pregnancy also complicated by late transfer of care (from another country) w/ limited availability of prenatal records, U/S at 38 weeks showing fetal UTD bilaterally, and EFW at 89%ile and AC at 93%ile, and hx of prior C/S  Labor: TOLAC; s/p FB. Continue to titrate IV Pit Fetal Wellbeing:  Cat I Pain Control:  Epidural Anticipated MOD:  SVD  Frederik PearJulie P Kaizley Aja, MD 10/17/2017, 4:15 PM

## 2017-10-17 NOTE — Anesthesia Preprocedure Evaluation (Signed)
Anesthesia Evaluation  Patient identified by MRN, date of birth, ID band Patient awake    Reviewed: Allergy & Precautions, NPO status , Patient's Chart, lab work & pertinent test results  Airway Mallampati: II  TM Distance: >3 FB Neck ROM: Full    Dental no notable dental hx.    Pulmonary asthma ,    Pulmonary exam normal breath sounds clear to auscultation       Cardiovascular negative cardio ROS Normal cardiovascular exam Rhythm:Regular Rate:Normal     Neuro/Psych negative neurological ROS  negative psych ROS   GI/Hepatic negative GI ROS, Neg liver ROS,   Endo/Other  negative endocrine ROS  Renal/GU negative Renal ROS     Musculoskeletal negative musculoskeletal ROS (+)   Abdominal (+) + obese,   Peds  Hematology negative hematology ROS (+)   Anesthesia Other Findings   Reproductive/Obstetrics (+) Pregnancy                             Anesthesia Physical Anesthesia Plan  ASA: II  Anesthesia Plan: Epidural   Post-op Pain Management:    Induction:   PONV Risk Score and Plan:   Airway Management Planned:   Additional Equipment:   Intra-op Plan:   Post-operative Plan:   Informed Consent: I have reviewed the patients History and Physical, chart, labs and discussed the procedure including the risks, benefits and alternatives for the proposed anesthesia with the patient or authorized representative who has indicated his/her understanding and acceptance.     Plan Discussed with:   Anesthesia Plan Comments:         Anesthesia Quick Evaluation

## 2017-10-18 ENCOUNTER — Encounter (HOSPITAL_COMMUNITY): Payer: Self-pay

## 2017-10-18 ENCOUNTER — Encounter: Payer: Self-pay | Admitting: *Deleted

## 2017-10-18 DIAGNOSIS — O403XX0 Polyhydramnios, third trimester, not applicable or unspecified: Secondary | ICD-10-CM

## 2017-10-18 DIAGNOSIS — Z3A39 39 weeks gestation of pregnancy: Secondary | ICD-10-CM

## 2017-10-18 LAB — CBC
HCT: 32.6 % — ABNORMAL LOW (ref 36.0–46.0)
Hemoglobin: 11.2 g/dL — ABNORMAL LOW (ref 12.0–15.0)
MCH: 31.3 pg (ref 26.0–34.0)
MCHC: 34.4 g/dL (ref 30.0–36.0)
MCV: 91.1 fL (ref 78.0–100.0)
PLATELETS: 233 10*3/uL (ref 150–400)
RBC: 3.58 MIL/uL — AB (ref 3.87–5.11)
RDW: 14 % (ref 11.5–15.5)
WBC: 13.5 10*3/uL — ABNORMAL HIGH (ref 4.0–10.5)

## 2017-10-18 MED ORDER — WITCH HAZEL-GLYCERIN EX PADS: 1.0000 "application " | MEDICATED_PAD | CUTANEOUS | Status: DC | PRN

## 2017-10-18 MED ORDER — METHYLERGONOVINE MALEATE 0.2 MG/ML IJ SOLN
0.2000 mg | Freq: Once | INTRAMUSCULAR | Status: AC
  Administered 2017-10-18: 0.2 mg via INTRAMUSCULAR

## 2017-10-18 MED ORDER — ONDANSETRON HCL 4 MG PO TABS: 4.0000 mg | ORAL_TABLET | ORAL | Status: DC | PRN

## 2017-10-18 MED ORDER — SENNOSIDES-DOCUSATE SODIUM 8.6-50 MG PO TABS
2.0000 | ORAL_TABLET | ORAL | Status: DC
  Administered 2017-10-18 – 2017-10-19 (×2): 2 via ORAL
  Filled 2017-10-18 (×2): qty 2

## 2017-10-18 MED ORDER — TETANUS-DIPHTH-ACELL PERTUSSIS 5-2.5-18.5 LF-MCG/0.5 IM SUSP: 0.5000 mL | Freq: Once | INTRAMUSCULAR | Status: DC

## 2017-10-18 MED ORDER — ONDANSETRON HCL 4 MG/2ML IJ SOLN: 4.0000 mg | INTRAMUSCULAR | Status: DC | PRN

## 2017-10-18 MED ORDER — BENZOCAINE-MENTHOL 20-0.5 % EX AERO
1.0000 "application " | INHALATION_SPRAY | CUTANEOUS | Status: DC | PRN
  Administered 2017-10-18: 1 via TOPICAL
  Filled 2017-10-18: qty 56

## 2017-10-18 MED ORDER — IBUPROFEN 600 MG PO TABS
600.0000 mg | ORAL_TABLET | Freq: Four times a day (QID) | ORAL | Status: DC
  Administered 2017-10-18 – 2017-10-20 (×10): 600 mg via ORAL
  Filled 2017-10-18 (×10): qty 1

## 2017-10-18 MED ORDER — PRENATAL MULTIVITAMIN CH
1.0000 | ORAL_TABLET | Freq: Every day | ORAL | Status: DC
  Administered 2017-10-18 – 2017-10-20 (×3): 1 via ORAL
  Filled 2017-10-18 (×3): qty 1

## 2017-10-18 MED ORDER — ACETAMINOPHEN 325 MG PO TABS: 650.0000 mg | ORAL_TABLET | ORAL | Status: DC | PRN

## 2017-10-18 MED ORDER — ZOLPIDEM TARTRATE 5 MG PO TABS: 5.0000 mg | ORAL_TABLET | Freq: Every evening | ORAL | Status: DC | PRN

## 2017-10-18 MED ORDER — COCONUT OIL OIL
1.0000 "application " | TOPICAL_OIL | Status: DC | PRN
  Filled 2017-10-18: qty 120

## 2017-10-18 MED ORDER — SIMETHICONE 80 MG PO CHEW: 80.0000 mg | CHEWABLE_TABLET | ORAL | Status: DC | PRN

## 2017-10-18 MED ORDER — DIBUCAINE 1 % RE OINT: 1.0000 "application " | TOPICAL_OINTMENT | RECTAL | Status: DC | PRN

## 2017-10-18 MED ORDER — DIPHENHYDRAMINE HCL 25 MG PO CAPS: 25.0000 mg | ORAL_CAPSULE | Freq: Four times a day (QID) | ORAL | Status: DC | PRN

## 2017-10-18 NOTE — Progress Notes (Signed)
Pt c/o dizziness upon assessment [facility] Vital signs as documented, fundal and lochia assessment WNL. Instructed in English to not get out of bed without RN. Pt verbalized understanding.

## 2017-10-18 NOTE — Anesthesia Postprocedure Evaluation (Signed)
Anesthesia Post Note  Patient: Jacqueline Macdonald  Procedure(s) Performed: AN AD HOC LABOR EPIDURAL     Patient location during evaluation: Mother Baby Anesthesia Type: Epidural Level of consciousness: awake and alert Pain management: pain level controlled Vital Signs Assessment: post-procedure vital signs reviewed and stable Respiratory status: spontaneous breathing and nonlabored ventilation Cardiovascular status: stable Postop Assessment: no headache, no backache, epidural receding, no apparent nausea or vomiting, patient able to bend at knees and adequate PO intake Anesthetic complications: no    Last Vitals:  Vitals:   10/18/17 0400 10/18/17 0445  BP: 139/86 137/77  Pulse: (!) 102 (!) 109  Resp: 18 20  Temp: 36.9 C   SpO2: 98% 97%    Last Pain:  Vitals:   10/18/17 0620  TempSrc:   PainSc: 0-No pain   Pain Goal:                 Land O'LakesMalinova,Rukiya Hodgkins Hristova

## 2017-10-19 NOTE — Progress Notes (Signed)
Post Partum Day 1 Subjective: no complaints, up ad lib, voiding, tolerating PO and + flatus  1st degree laceration and periurethral laceration Patient is doing well.   Objective: Blood pressure 126/74, pulse 96, temperature 98.5 F (36.9 C), resp. rate 18, height 5\' 3"  (1.6 m), weight 87.1 kg (192 lb), SpO2 97 %, unknown if currently breastfeeding.  Physical Exam:  General: alert, cooperative and no distress Lochia: appropriate Uterine Fundus: firm Incision: NA DVT Evaluation: No evidence of DVT seen on physical exam. No significant calf/ankle edema.  Recent Labs    10/17/17 2026 10/18/17 0505  HGB 12.3 11.2*  HCT 36.2 32.6*    Assessment/Plan: Patient is a 27 y.o. [id number] PPD1 s/v VBAC. Doing well  Plan for discharge tomorrow, Breastfeeding and bottle feeding, and Contraception undecided, discussed options with patient and gave handouts in arabic. Information given on outpatient clinics for circumcision.   [employer] interpreter used for Arabic translation during this encounter.    LOS: 2 days   Ilsa Ihaicole Trombetta PA-S2 10/19/2017, 7:50 AM    I confirm that I have verified the information documented in the physician assistant's note and that I have also personally reperformed the physical exam and all medical decision making activities.   Luna KitchensKathryn Kooistra CNM

## 2017-10-19 NOTE — Lactation Note (Signed)
This note was copied from a baby's chart. Lactation Consultation Note P2 mom.  Speaks arabic.  Interpreter [id number] used.  Mom has 29 year old she bf for 1 yr. 7 months.  Mom states she had to supplement with formula for the first 6 months.  First child was born in [place].  Mom states she feels like the infant isn't latching well and her nipples are beginning to get sore.  LC encourages mom to hand express prior to attempting to latch infant.  LC taught mom to massage first.  Mom used index and middle finger scissoring to express and was able to express into two spoons a total of 6 ml.  3 ml were finger fed to infant prior to latching.  Mom states she feeds baby across her so LC suggested football.  When finger feeding, it took several minutes before infant would suck instead of tongue thrusting.  After sucking began, LC placed in football hold.  Mom attempted several times before infant could sustain latch.  Mom has short shafted nipples and dense areola tissue.  Infant did finally sustain latch with gape wide and bottom lip flanged, top lip tucked but LC was able to untuck.  Mom immediately began compressing breast and swallows were easily heard.  Infant stayed in good rhythmic jaw motion with continual swallowing spontaneously.  Arm completely  relaxed after 20 minutes into feed.  Mom told LC this is much better of a latch.  LC encouraged mom to hand express and get flow of colostrum going prior to latching infant.  And to use expressed breast milk after feed as well to protect nipple.  Coconut oil was provided for mom as well.  LC reiterated importance of deep latch and good positioning. [facility] was given [facility] broch[facility], resource sheet, support group sheet, and questions were all answered.  3 ml of ebm left in spoon on counter,  Mom states she will finger feed it to infant after feed.      Patient Name: Jacqueline Faylene Kurtzsmaa Yeates [id number]'s Date: 10/19/2017 Reason for consult: Initial assessment   Maternal Data Formula Feeding  for Exclusion: No Has patient been taught Hand Expression?: Yes Does the patient have breastfeeding experience prior to this delivery?: Yes  Feeding Feeding Type: Breast Fed Length of feed: 35 min  LATCH Score Latch: Repeated attempts needed to sustain latch, nipple held in mouth throughout feeding, stimulation needed to elicit sucking reflex.  Audible Swallowing: A few with stimulation  Type of Nipple: Everted at rest and after stimulation(short, denser tissue,semi compressible)  Comfort (Breast/Nipple): Filling, red/small blisters or bruises, mild/mod discomfort  Hold (Positioning): Assistance needed to correctly position infant at breast and maintain latch.  LATCH Score: 6  Interventions Interventions: Breast feeding basics reviewed;Assisted with latch;Skin to skin;Breast massage;Hand express;Breast compression;Coconut oil;Expressed milk;Position options;Support pillows;Adjust position  Lactation Tools Discussed/Used Tools: Coconut oil WIC Program: No   Consult Status Consult Status: Follow-up Date: 10/20/17 Follow-up type: In-patient    Jacqueline Macdonald 10/19/2017, 4:49 PM

## 2017-10-20 ENCOUNTER — Encounter: Payer: Self-pay | Admitting: Student

## 2017-10-20 MED ORDER — IBUPROFEN 600 MG PO TABS: 600.0000 mg | ORAL_TABLET | Freq: Four times a day (QID) | ORAL | 0 refills | Status: DC

## 2017-10-20 NOTE — Discharge Summary (Signed)
OB Discharge Summary     Patient Name: Jacqueline Macdonald DOB: [birth year] MRN: [id number]  Date of admission: 10/17/2017 Delivering MD: Pincus Large   Date of discharge: 10/20/2017  Admitting diagnosis: INDUCTION Intrauterine pregnancy: [redacted]w[redacted]d     Secondary diagnosis:  Principal Problem:   Vaginal delivery Active Problems:   H/O: C-section   Polyhydramnios  Additional problems: intrapartum gHTN     Discharge diagnosis: Term Pregnancy Delivered and VBAC                                                                                                Post partum procedures:n/a  Augmentation: AROM, Pitocin and Foley Balloon  Complications: Intrauterine Inflammation or infection (Chorioamniotis)  Hospital course:  Induction of Labor With Vaginal Delivery   27 y.o. yo [id number] at [redacted]w[redacted]d was admitted to the hospital 10/17/2017 for induction of labor.  Indication for induction: polyhydramnios.  Patient had an uncomplicated labor course as follows: Membrane Rupture Time/Date: 10:56 PM ,10/17/2017   Intrapartum Procedures: Episiotomy: None [1]                                         Lacerations:  1st degree [2];Periurethral [8]  Patient had delivery of a Viable infant.  Information for the patient's newborn:  Waniya, Hoglund [[id number]]  Delivery Method: VBAC, Spontaneous(Filed from Delivery Summary)   10/18/2017  Details of delivery can be found in separate delivery note.  Patient had a routine postpartum course. Patient is discharged home 10/20/17.  Physical exam  Vitals:   10/19/17 0522 10/19/17 1150 10/19/17 1654 10/20/17 0537  BP: 126/74  122/81 127/89  Pulse: 96  84 91  Resp: 18  18 16   Temp: 98.5 F (36.9 C)  97.9 F (36.6 C) 98.4 F (36.9 C)  TempSrc:   Axillary Oral  SpO2:      Weight:  86.8 kg (191 lb 4.8 oz)    Height:       General: alert, cooperative and no distress Lochia: appropriate Uterine Fundus: firm Incision: N/A DVT Evaluation: No evidence of DVT seen on  physical exam. Labs: Lab Results  Component Value Date   WBC 13.5 (H) 10/18/2017   HGB 11.2 (L) 10/18/2017   HCT 32.6 (L) 10/18/2017   MCV 91.1 10/18/2017   PLT 233 10/18/2017   CMP Latest Ref Rng & Units 10/17/2017  Glucose 65 - 99 mg/dL 66  BUN 6 - 20 mg/dL 8  Creatinine 1.30 - 8.65 mg/dL 7.84  Sodium 696 - 295 mmol/L 134(L)  Potassium 3.5 - 5.1 mmol/L 4.0  Chloride 101 - 111 mmol/L 103  CO2 22 - 32 mmol/L 20(L)  Calcium 8.9 - 10.3 mg/dL 8.9  Total Protein 6.5 - 8.1 g/dL 7.1  Total Bilirubin 0.3 - 1.2 mg/dL 1.1  Alkaline Phos 38 - 126 U/L 187(H)  AST 15 - 41 U/L 23  ALT 14 - 54 U/L 14    Discharge instruction: per After Visit Summary and "Baby and Me Booklet".  After visit meds:  Allergies as of 10/20/2017   No Known Allergies     Medication List    STOP taking these medications   prenatal multivitamin Tabs tablet     TAKE these medications   ibuprofen 600 MG tablet Commonly known as:  ADVIL,MOTRIN Take 1 tablet (600 mg total) by mouth every 6 (six) hours.   IRON PO Take 1 tablet by mouth daily. Pt not sure of dosage and not sure info for pharmacy filled   PRESCRIPTION MEDICATION Inhale 1 puff into the lungs daily as needed. Pt uses an inhaler during allergy season and when she gets angry  (does not know the name )  It was prescribed in her home country [place].       Diet: routine diet  Activity: Advance as tolerated. Pelvic rest for 6 weeks.   Outpatient follow up:4 weeks Follow up Appt: Future Appointments  Date Time Provider Department Center  11/30/2017  9:15 AM Dorathy [place] [facility]   Follow up Visit:No Follow-up on file.  Postpartum contraception: Undecided  Newborn Data: Live born female  Birth Weight: 7 lb 15.5 oz (3615 g) APGAR: 8, 9  Newborn Delivery   Birth date/time:  10/18/2017 01:57:00 Delivery type:  VBAC, Spontaneous     Baby Feeding: Breast Disposition:home with mother   10/20/2017 Alroy BailiffParker W Ariana Cavenaugh, MD

## 2017-10-21 ENCOUNTER — Ambulatory Visit: Payer: Self-pay

## 2017-10-21 NOTE — Lactation Note (Signed)
This note was copied from a baby's chart. Lactation Consultation Note: Used interpreter Ronnie [phone]#[phone] for my visit. Mom reports breast feeding is going well but has some pain at beginning of feedings that then eases off after a few minutes. Has some healing positional stripes on both nipples. Both breasts are beginning to fill. Has manual pump for home. Pumped this morning and obtained about 2 oz. Has bottle in bassinet drawer with about 10 ml left in it. Reviewed the need to throw milk out once the baby is finished with the feeding. No further questions at present.  Patient Name: [name] [id number]'s Date: 10/21/2017 Reason for consult: Follow-up assessment   Maternal Data Formula Feeding for Exclusion: Yes Reason for exclusion: Mother's choice to formula and breast feed on admission Has patient been taught Hand Expression?: Yes Does the patient have breastfeeding experience prior to this delivery?: Yes  Feeding Feeding Type: Breast Fed Length of feed: 20 min  LATCH Score       Type of Nipple: Everted at rest and after stimulation  Comfort (Breast/Nipple): Filling, red/small blisters or bruises, mild/mod discomfort        Interventions    Lactation Tools Discussed/Used     Consult Status Consult Status: Complete    Pamelia HoitWeeks, Courtany Mcmurphy D 10/21/2017, 9:48 AM

## 2017-11-30 ENCOUNTER — Ambulatory Visit: Payer: Self-pay | Admitting: Advanced Practice Midwife

## 2018-01-19 ENCOUNTER — Encounter: Payer: Self-pay | Admitting: *Deleted

## 2018-05-20 IMAGING — US US MFM FETAL BPP W/O NON-STRESS
1 series · 13 of 28 positions shown · non-contrast
Comparison: none

[Series 1: us mfm fetal bpp w/o non-stress · 75 acquisitions, 13 frames shown]
[im 3/75]
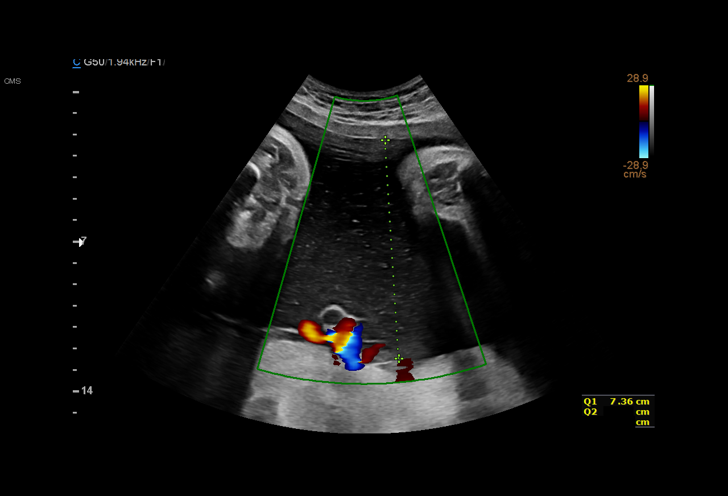
[im 9/75]
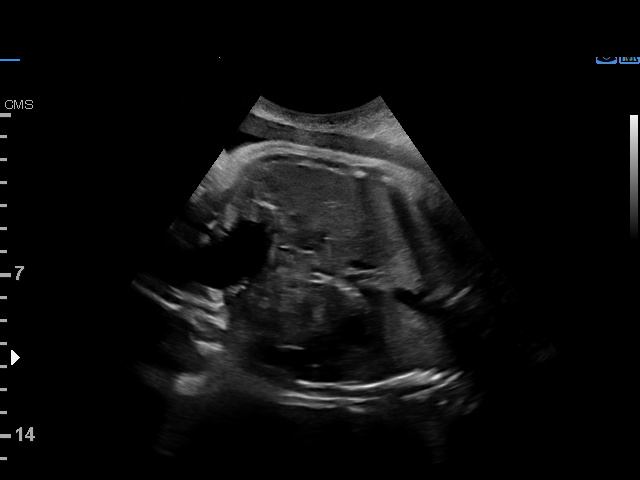
[im 14/75]
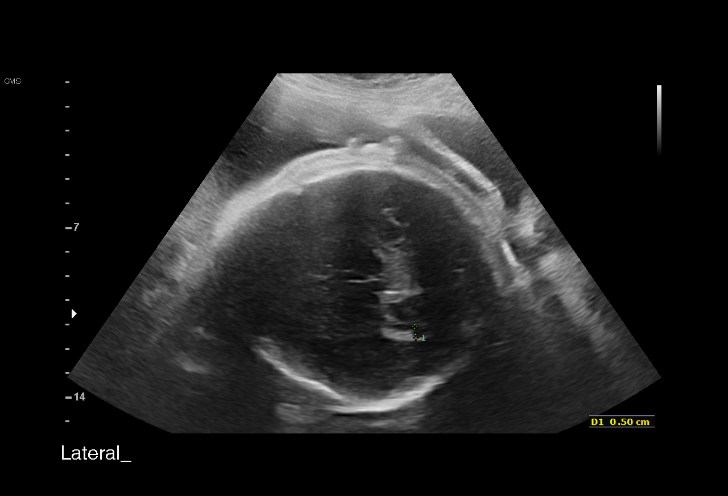
[im 20/75]
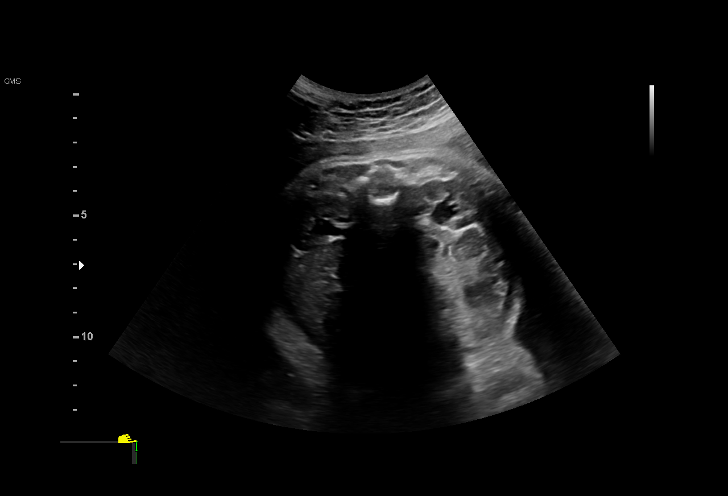
[im 25/75]
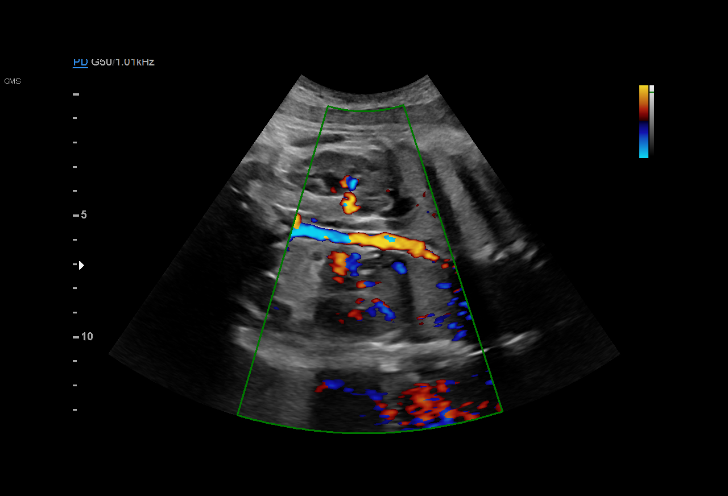
[im 31/75]
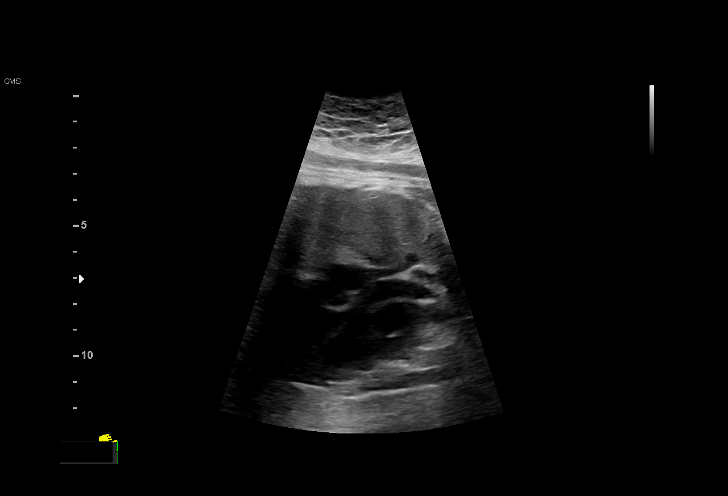
[im 39/75]
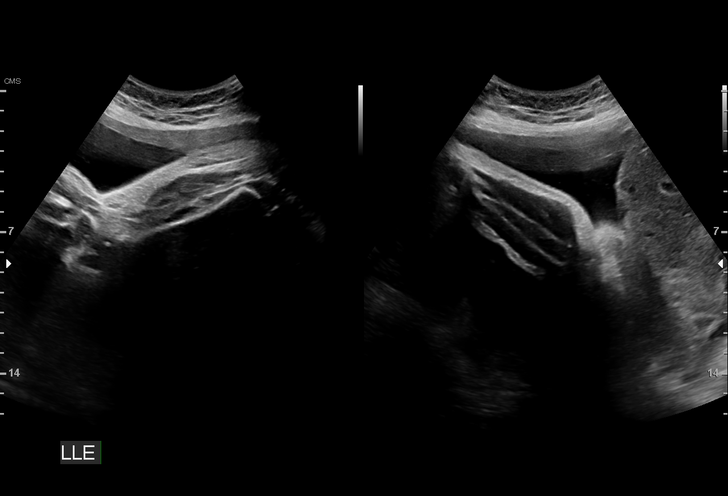
[im 44/75]
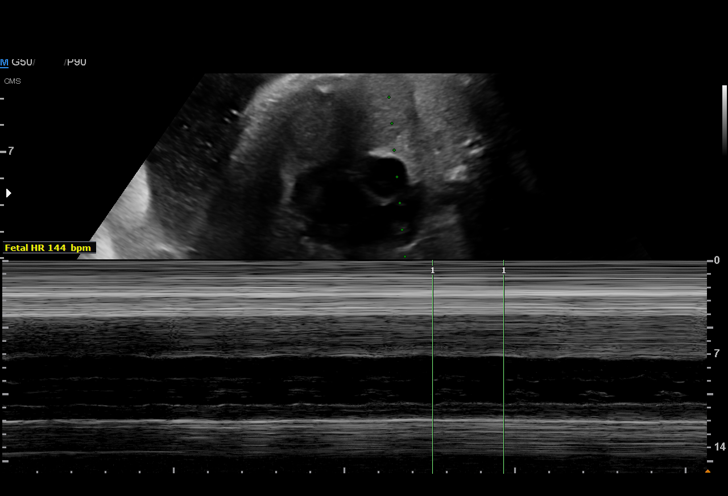
[im 50/75]
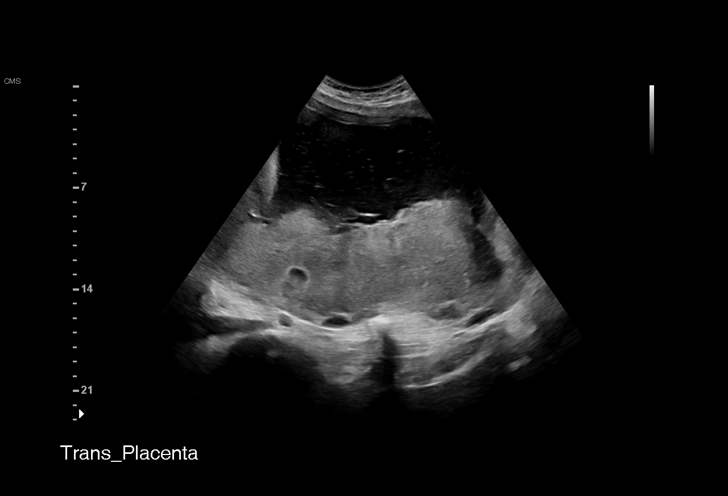
[im 55/75]
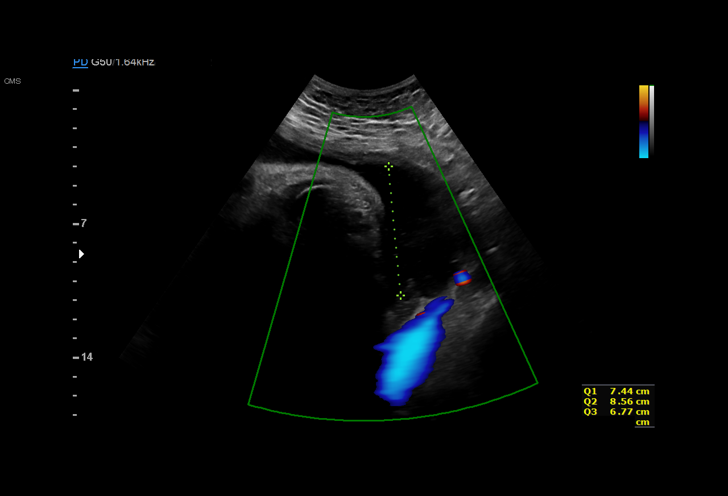
[im 61/75]
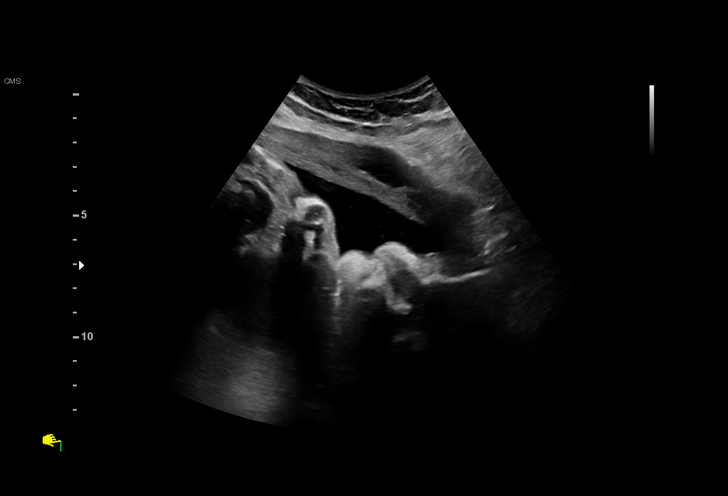
[im 66/75]
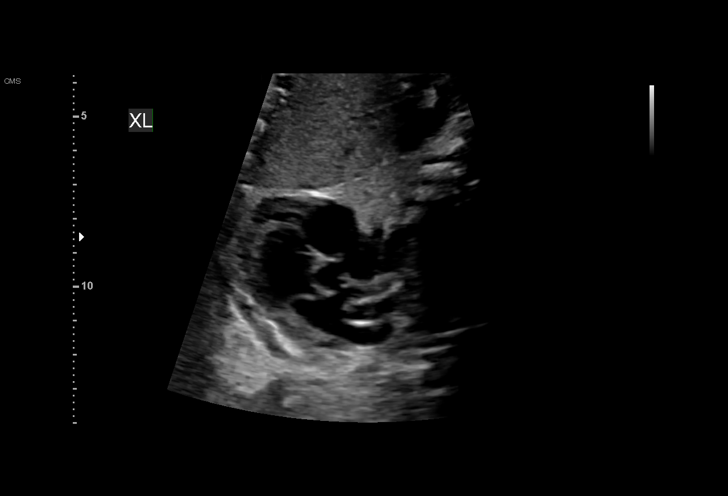
[im 72/75]
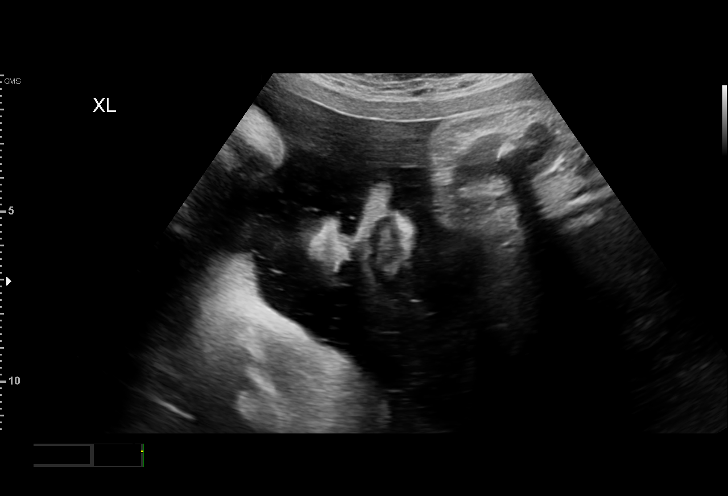

[13 of 28 positions shown; findings below may reference images not displayed]

OB/Gyn Clinic

1  NORIKATSU SOGO              [id number]      [id number]     [id number]
2  NORIKATSU SOGO              [id number]      [id number]     [id number]
Indications

38 weeks gestation of pregnancy
Encounter for fetal anatomic survey
Previous cesarean delivery, antepartum
Polyhydramnios, third trimester, antepartum
condition or complication, unspecified fetus
Fetal abnormality - other known or
suspected (renal pyelectasis)
OB History

Gravidity:    3         Term:   1        Prem:   0        SAB:   0
TOP:          1       Ectopic:  0        Living: 1
Fetal Evaluation

Num Of Fetuses:     1
Fetal Heart         144
Rate(bpm):
Cardiac Activity:   Observed
Presentation:       Cephalic
Placenta:           Posterior, above cervical os
P. Cord Insertion:  Visualized
Amniotic Fluid
AFI FV:      Polyhydramnios

AFI Sum(cm)     %Tile       Largest Pocket(cm)
27.79           > 97

RUQ(cm)       RLQ(cm)       LUQ(cm)        LLQ(cm)
7.36
Biometry

BPD:      96.2  mm     G. Age:  39w 2d         90  %    CI:        75.15   %    70 - 86
FL/HC:      19.7   %    20.6 -
HC:       352   mm     G. Age:  41w 1d         86  %    HC/AC:      0.98        0.87 -
AC:      359.7  mm     G. Age:  39w 6d         93  %    FL/BPD:     72.0   %    71 - 87
FL:       69.3  mm     G. Age:  35w 4d        < 3  %    FL/AC:      19.3   %    20 - 24
HUM:      61.3  mm     G. Age:  35w 3d         18  %

Est. FW:    2096  gm      8 lb 1 oz     89  %
Gestational Age

U/S Today:     39w 0d                                        EDD:   10/21/17
Best:          38w 4d     Det. By:  Previous Ultrasound      EDD:   10/24/17
(07/27/17)
Anatomy

Cranium:               Appears normal         Aortic Arch:            Appears normal
Cavum:                 Not well visualized    Ductal Arch:            Not well visualized
Ventricles:            Appears normal         Diaphragm:              Appears normal
Choroid Plexus:        Not well visualized    Stomach:                Appears normal, left
sided
Cerebellum:            Not well visualized    Abdomen:                Appears normal
Posterior Fossa:       Not well visualized    Abdominal Wall:         Not well visualized
Nuchal Fold:           Not applicable (>20    Cord Vessels:           Appears normal (3
wks GA)                                        vessel cord)
Face:                  Orbits nl; profile not Kidneys:                Bilateral UTD (7-
well visualized
9mm)
Lips:                  Appears normal         Bladder:                Appears normal
Thoracic:              Appears normal         Spine:                  Limited views
appear normal
Heart:                 Appears normal         Upper Extremities:      Visualized
(4CH, axis, and situs
RVOT:                  Appears normal         Lower Extremities:      Visualized
LVOT:                  Appears normal

Other:  Fetus appears to be a male.
Cervix Uterus Adnexa

Cervix
Not visualized (advanced GA >65wks)
Impression

SIUP at 84w5d
active singleton fetus
Weight:  3191gm
EFW 89th%'le, AC 93rd%'le
bilateral urinary tract dilation (7-9mm)
renal parenchyma appears normal
polyhydramnios
no previa
BPP  [DATE]
Collectively findings may be representative of occult GDM.
Arabic interpreter #[id number]
Recommendations

1. bilateral UTD:  recommend postnatal renal ultrasound and
pediatric urology evaluation if UTD persists.  Given
gestational age, genetic testing if desired by pediatrics by
exam may be most appropriate.
2. polyhydramnios:  recommend delivery at or shortly after 39
weeks, especially given the concern for occult GDM.  If
expectant management is pursued beyond 7 days,
recommend repeat BPP.  I spoke with OB faculty who is
arranging IOL at the beginning of next week (eg, [REDACTED],
[REDACTED]).

## 2018-09-27 NOTE — L&D Delivery Note (Addendum)
OB/GYN Faculty Practice Delivery Note  Jacqueline Macdonald is a 28 y.o. [id number] s/p SVD at [redacted]w[redacted]d. She was admitted for IOL for PreEclampsia without severe features.   ROM: 0h 19m with 275 mls fluid GBS Status: GBS negative Maximum Maternal Temperature: 98.9  Labor Progress: . Achieved complete cervical dilation at 1510 after Pitocin augmentation.  No further augmentation of labor required.  Progression to delivery  Delivery Date/Time: 08/21/2019 at  Delivery: Called to room and patient was complete and pushing. Head delivered LOA. Loose nuchal cord present x1 and easily reduced. Shoulder and body delivered in usual fashion. Infant with spontaneous cry, placed on mother's abdomen, dried and stimulated. Cord clamped x 2 after 1-minute delay, and cut by FOB. Cord blood drawn. Placenta delivered spontaneously with gentle cord traction. Fundus firm with massage and Pitocin. Labia, perineum, vagina, and cervix inspected and found second degree perineal lac, repaired with 2.0 Vicryl Rapide and hemostasis achieved.   Placenta: 3 vessel cord, intact Complications: None Lacerations: Second degree perineal QBL: 275 mls Analgesia: Epidural  Postpartum Planning [X]  message to sent to schedule follow-up  [X]  vaccines UTD  Infant: girl  APGARs 9/9   [place] for [place], [place] 08/21/2019, 3:56 PM  Midwife attestation: I was gloved and present for delivery in its entirety and I agree with the above resident's note.  Julianne Handler, CNM 6:08 PM

## 2019-08-07 ENCOUNTER — Other Ambulatory Visit: Payer: Self-pay

## 2019-08-07 ENCOUNTER — Inpatient Hospital Stay (HOSPITAL_COMMUNITY)
Admission: AD | Admit: 2019-08-07 | Discharge: 2019-08-07 | Disposition: A | Discharge status: Home / Self Care | Payer: Medicaid Other | Attending: Family Medicine | Admitting: Family Medicine

## 2019-08-07 ENCOUNTER — Encounter (HOSPITAL_COMMUNITY): Payer: Self-pay | Admitting: *Deleted

## 2019-08-07 DIAGNOSIS — Z79899 Other long term (current) drug therapy: Secondary | ICD-10-CM | POA: Insufficient documentation

## 2019-08-07 DIAGNOSIS — O99513 Diseases of the respiratory system complicating pregnancy, third trimester: Secondary | ICD-10-CM | POA: Insufficient documentation

## 2019-08-07 DIAGNOSIS — Z791 Long term (current) use of non-steroidal anti-inflammatories (NSAID): Secondary | ICD-10-CM | POA: Insufficient documentation

## 2019-08-07 DIAGNOSIS — O99891 Other specified diseases and conditions complicating pregnancy: Secondary | ICD-10-CM | POA: Insufficient documentation

## 2019-08-07 DIAGNOSIS — R109 Unspecified abdominal pain: Secondary | ICD-10-CM | POA: Insufficient documentation

## 2019-08-07 DIAGNOSIS — Z3689 Encounter for other specified antenatal screening: Secondary | ICD-10-CM | POA: Insufficient documentation

## 2019-08-07 DIAGNOSIS — M5430 Sciatica, unspecified side: Secondary | ICD-10-CM | POA: Insufficient documentation

## 2019-08-07 DIAGNOSIS — Z3A37 37 weeks gestation of pregnancy: Secondary | ICD-10-CM | POA: Insufficient documentation

## 2019-08-07 DIAGNOSIS — J45909 Unspecified asthma, uncomplicated: Secondary | ICD-10-CM | POA: Insufficient documentation

## 2019-08-07 DIAGNOSIS — O34219 Maternal care for unspecified type scar from previous cesarean delivery: Secondary | ICD-10-CM | POA: Insufficient documentation

## 2019-08-07 DIAGNOSIS — O26891 Other specified pregnancy related conditions, first trimester: Secondary | ICD-10-CM

## 2019-08-07 DIAGNOSIS — M5431 Sciatica, right side: Secondary | ICD-10-CM

## 2019-08-07 LAB — HEMOGLOBIN A1C
Hgb A1c MFr Bld: 4.9 % (ref 4.8–5.6)
Mean Plasma Glucose: 93.93 mg/dL

## 2019-08-07 LAB — BASIC METABOLIC PANEL
Anion gap: 11 (ref 5–15)
BUN: 9 mg/dL (ref 6–20)
CO2: 19 mmol/L — ABNORMAL LOW (ref 22–32)
Calcium: 9.4 mg/dL (ref 8.9–10.3)
Chloride: 105 mmol/L (ref 98–111)
Creatinine, Ser: 0.37 mg/dL — ABNORMAL LOW (ref 0.44–1.00)
GFR calc Af Amer: >60 mL/min (ref >60)
GFR calc non Af Amer: >60 mL/min (ref >60)
Glucose, Bld: 91 mg/dL (ref 70–99)
Potassium: 3.6 mmol/L (ref 3.5–5.1)
Sodium: 135 mmol/L (ref 135–145)

## 2019-08-07 LAB — URINALYSIS, ROUTINE W REFLEX MICROSCOPIC
Bilirubin Urine: NEGATIVE
Glucose, UA: 50 mg/dL — AB
Hgb urine dipstick: NEGATIVE
Ketones, ur: NEGATIVE mg/dL
Nitrite: NEGATIVE
Protein, ur: NEGATIVE mg/dL
Specific Gravity, Urine: 1.018 (ref 1.005–1.030)
pH: 5 (ref 5.0–8.0)

## 2019-08-07 LAB — CBC
HCT: 35.8 % — ABNORMAL LOW (ref 36.0–46.0)
Hemoglobin: 12 g/dL (ref 12.0–15.0)
MCH: 31.3 pg (ref 26.0–34.0)
MCHC: 33.5 g/dL (ref 30.0–36.0)
MCV: 93.5 fL (ref 80.0–100.0)
Platelets: 241 10*3/uL (ref 150–400)
RBC: 3.83 MIL/uL — ABNORMAL LOW (ref 3.87–5.11)
RDW: 13.9 % (ref 11.5–15.5)
WBC: 7.8 10*3/uL (ref 4.0–10.5)
nRBC: 0 % (ref 0.0–0.2)

## 2019-08-07 LAB — HIV ANTIBODY (ROUTINE TESTING W REFLEX): HIV Screen 4th Generation wRfx: NONREACTIVE

## 2019-08-07 MED ORDER — ACETAMINOPHEN 500 MG PO TABS
1000.0000 mg | ORAL_TABLET | Freq: Once | ORAL | Status: AC
  Administered 2019-08-07: 18:00:00 1000 mg via ORAL
  Filled 2019-08-07: qty 2

## 2019-08-07 MED ORDER — CYCLOBENZAPRINE HCL 10 MG PO TABS
10.0000 mg | ORAL_TABLET | Freq: Once | ORAL | Status: AC
  Administered 2019-08-07: 18:00:00 10 mg via ORAL
  Filled 2019-08-07: qty 1

## 2019-08-07 NOTE — MAU Provider Note (Signed)
History     CSN: [id number]  Arrival date and time: 08/07/19 1623   First Provider Initiated Contact with Patient 08/07/19 1720      Chief Complaint  Patient presents with  . Abdominal Pain   HPI Nevae Pinnix is a 28 y.o. [id number] at [redacted]w[redacted]d who presents to MAU with chief complaint of sciatic nerve pain. She endorses pain in her right buttock which radiates down her right leg. This is a recurrent problem, onset within the past three weeks and worsened by prolonged standing, housework, and walking. She also feels perineal pressure which she associates with fetal movement. She denies vaginal bleeding, leaking of fluid, decreased fetal movement, fever, falls, or recent illness.   Patient endorses routine prenatal care in [place] but she plans to deliver at [facility]. She has not had any prenatal care in the [place] and declines offer of appointment.  OB History    Gravida  4   Para  2   Term  2   Preterm      AB  1   Living  2     SAB      TAB  1   Ectopic      Multiple  0   Live Births  2           Past Medical History:  Diagnosis Date  . Asthma     Past Surgical History:  Procedure Laterality Date  . CESAREAN SECTION      No family history on file.  Social History   Tobacco Use  . Smoking status: Never Smoker  . Smokeless tobacco: Never Used  Substance Use Topics  . Alcohol use: No    Frequency: Never  . Drug use: No    Allergies: No Known Allergies  Medications Prior to Admission  Medication Sig Dispense Refill Last Dose  . Prenatal Vit-Fe Fumarate-FA (PRENATAL MULTIVITAMIN) TABS tablet Take 1 tablet by mouth daily at 12 noon.     Marland Kitchen ibuprofen (ADVIL,MOTRIN) 600 MG tablet Take 1 tablet (600 mg total) by mouth every 6 (six) hours. 60 tablet 0   . IRON PO Take 1 tablet by mouth daily. Pt not sure of dosage and not sure info for pharmacy filled     . PRESCRIPTION MEDICATION Inhale 1 puff into the lungs daily as needed. Pt uses an inhaler during allergy season  and when she gets angry  (does not know the name )  It was prescribed in her home country [place].       Review of Systems  Constitutional: Negative for fever.  Respiratory: Negative for shortness of breath.   Gastrointestinal: Negative for abdominal pain.  Genitourinary: Positive for pelvic pain. Negative for vaginal bleeding, vaginal discharge and vaginal pain.  Musculoskeletal: Negative for back pain.  All other systems reviewed and are negative.  Physical Exam   Blood pressure 137/80, pulse 97, temperature 98.4 F (36.9 C), resp. rate 18, height 5' 2.99" (1.6 m), weight 97.1 kg, unknown if currently breastfeeding.  Physical Exam  Nursing note and vitals reviewed. Constitutional: She is oriented to person, place, and time. She appears well-developed and well-nourished.  Cardiovascular: Normal rate.  Respiratory: Effort normal and breath sounds normal. She has no decreased breath sounds.  GI: Soft. There is no abdominal tenderness. There is no CVA tenderness.  Gravid  Genitourinary:    Vagina and uterus normal.   Musculoskeletal: Normal range of motion.  Neurological: She is alert and oriented to person, place, and time.  Skin: Skin is warm and dry.  Psychiatric: She has a normal mood and affect. Her behavior is normal. Judgment and thought content normal.    MAU Course/MDM  Procedures  --Fundal height 37cm --Reactive tracing: baseline 140, mod variability, positive 15 x 15 accels, no decels --Toco: occasional UI, no ctx noted --Pain score improving one hour after PO meds --OB hx C/s at 40 weeks in 2016 followed by [place] with faculty practice in 2019. Offered to coordinate transfer of care to [facility], patient declined. Basic labs collected in event patient returns to MAU without being seen in clinic  Patient Vitals for the past 24 hrs:  BP Temp Pulse Resp Height Weight  08/07/19 1855 136/80 - 92 - - -  08/07/19 1751 137/80 - 97 - - -  08/07/19 1742 134/76 - 97 - - -  08/07/19  1732 (!) 136/97 - (!) 108 - - -  08/07/19 1721 136/80 - (!) 101 - - -  08/07/19 1713 129/83 - (!) 109 - - -  08/07/19 1651 135/89 98.4 F (36.9 C) 98 18 5' 2.99" (1.6 m) 97.1 kg   Meds ordered this encounter  Medications  . acetaminophen (TYLENOL) tablet 1,000 mg  . cyclobenzaprine (FLEXERIL) tablet 10 mg   Orders Placed This Encounter  Procedures  . Culture, OB Urine  . GBS culture  . Urinalysis, Routine w reflex microscopic  . CBC  . Basic metabolic panel  . RPR  . HIV Antibody (routine testing w rflx)  . Hemoglobin A1c  . Discharge patient   Assessment and Plan  --28 y.o. [id number] at [redacted]w[redacted]d  --Reactive tracing --Sciatic nerve pain at term --Discharge home in stable condition with labor precautions  Darlina Rumpf, CNM 08/07/2019, 9:04 PM

## 2019-08-07 NOTE — Discharge Instructions (Signed)

## 2019-08-07 NOTE — MAU Note (Signed)
Pt reports she has a been having pain in her lower back and abd that comes when she does any kind of activity like walking or housework Good fetal movement felt. Denies any vag bleeding but reported clear mucusy discharge.

## 2019-08-08 LAB — CULTURE, OB URINE: Culture: <10000 — AB

## 2019-08-08 LAB — RPR: RPR Ser Ql: NONREACTIVE

## 2019-08-09 LAB — CULTURE, BETA STREP (GROUP B ONLY)

## 2019-08-20 ENCOUNTER — Encounter (HOSPITAL_COMMUNITY): Payer: Self-pay

## 2019-08-20 ENCOUNTER — Inpatient Hospital Stay (HOSPITAL_COMMUNITY)
Admission: AD | Admit: 2019-08-20 | Discharge: 2019-08-20 | Disposition: A | Discharge status: Home / Self Care | Payer: Self-pay | Attending: Obstetrics and Gynecology | Admitting: Obstetrics and Gynecology

## 2019-08-20 DIAGNOSIS — Z20828 Contact with and (suspected) exposure to other viral communicable diseases: Secondary | ICD-10-CM | POA: Insufficient documentation

## 2019-08-20 DIAGNOSIS — O479 False labor, unspecified: Secondary | ICD-10-CM

## 2019-08-20 DIAGNOSIS — O34219 Maternal care for unspecified type scar from previous cesarean delivery: Secondary | ICD-10-CM | POA: Insufficient documentation

## 2019-08-20 DIAGNOSIS — O368130 Decreased fetal movements, third trimester, not applicable or unspecified: Secondary | ICD-10-CM | POA: Insufficient documentation

## 2019-08-20 DIAGNOSIS — Z3A39 39 weeks gestation of pregnancy: Secondary | ICD-10-CM | POA: Insufficient documentation

## 2019-08-20 DIAGNOSIS — O1493 Unspecified pre-eclampsia, third trimester: Secondary | ICD-10-CM | POA: Insufficient documentation

## 2019-08-20 LAB — URINALYSIS, ROUTINE W REFLEX MICROSCOPIC
Bacteria, UA: NONE SEEN
Bilirubin Urine: NEGATIVE
Glucose, UA: NEGATIVE mg/dL
Hgb urine dipstick: NEGATIVE
Ketones, ur: NEGATIVE mg/dL
Nitrite: NEGATIVE
Protein, ur: 30 mg/dL — AB
Specific Gravity, Urine: 1.021 (ref 1.005–1.030)
pH: 6 (ref 5.0–8.0)

## 2019-08-20 LAB — CBC
HCT: 35.5 % — ABNORMAL LOW (ref 36.0–46.0)
Hemoglobin: 12.3 g/dL (ref 12.0–15.0)
MCH: 32.1 pg (ref 26.0–34.0)
MCHC: 34.6 g/dL (ref 30.0–36.0)
MCV: 92.7 fL (ref 80.0–100.0)
Platelets: 269 10*3/uL (ref 150–400)
RBC: 3.83 MIL/uL — ABNORMAL LOW (ref 3.87–5.11)
RDW: 13.6 % (ref 11.5–15.5)
WBC: 7 10*3/uL (ref 4.0–10.5)
nRBC: 0 % (ref 0.0–0.2)

## 2019-08-20 LAB — COMPREHENSIVE METABOLIC PANEL
ALT: 17 U/L (ref 0–44)
AST: 17 U/L (ref 15–41)
Albumin: 2.7 g/dL — ABNORMAL LOW (ref 3.5–5.0)
Alkaline Phosphatase: 115 U/L (ref 38–126)
Anion gap: 9 (ref 5–15)
BUN: 10 mg/dL (ref 6–20)
CO2: 22 mmol/L (ref 22–32)
Calcium: 9.1 mg/dL (ref 8.9–10.3)
Chloride: 106 mmol/L (ref 98–111)
Creatinine, Ser: 0.43 mg/dL — ABNORMAL LOW (ref 0.44–1.00)
GFR calc Af Amer: >60 mL/min (ref >60)
GFR calc non Af Amer: >60 mL/min (ref >60)
Glucose, Bld: 98 mg/dL (ref 70–99)
Potassium: 3.8 mmol/L (ref 3.5–5.1)
Sodium: 137 mmol/L (ref 135–145)
Total Bilirubin: 0.3 mg/dL (ref 0.3–1.2)
Total Protein: 6.4 g/dL — ABNORMAL LOW (ref 6.5–8.1)

## 2019-08-20 LAB — PROTEIN / CREATININE RATIO, URINE
Creatinine, Urine: 71.77 mg/dL
Protein Creatinine Ratio: 0.49 mg/mg{Cre} — ABNORMAL HIGH (ref 0.00–0.15)
Total Protein, Urine: 35 mg/dL

## 2019-08-20 NOTE — MAU Note (Signed)
Patient reports not feeling her baby move for the past 3 days.  Also reports some occasional cramping and "heaviness."  No LOF.  Some brown discharge for the past 3 days.

## 2019-08-20 NOTE — MAU Provider Note (Signed)
Chief Complaint:  Decreased Fetal Movement and Vaginal Discharge   First Provider Initiated Contact with Patient 08/20/19 2051    VIDEO INTERPRETOR USED WITH EACH INTERACTION   HPI: Jacqueline Macdonald is a 28 y.o. [id number] at 4739w2dwho presents to maternity admissions reporting Decreased fetal movement for 3 days.  Also has some cramping and pressure.  . She denies LOF, vaginal bleeding, vaginal itching/burning, urinary symptoms, h/a, dizziness, n/v, diarrhea, constipation or fever/chills.  She denies headache, visual changes or RUQ abdominal pain.  History of C/S for breech [facility]  Hypertension This is a new problem. The current episode started today. The problem is unchanged. Associated symptoms include peripheral edema. Pertinent negatives include no anxiety, blurred vision, chest pain, headaches, palpitations or shortness of breath. There are no associated agents to hypertension. There are no known risk factors for coronary artery disease. Past treatments include nothing. There are no compliance problems (Had care in [place], no records, declined offer of office visits).    RN Note: Patient reports not feeling her baby move for the past 3 days.  Also reports some occasional cramping and "heaviness."  No LOF.  Some brown discharge for the past 3 days.    Past Medical History: Past Medical History:  Diagnosis Date  . Asthma     Past obstetric history: OB History  Gravida Para Term Preterm AB Living  4 2 2   1 2   SAB TAB Ectopic Multiple Live Births    1   0 2    # Outcome Date GA Lbr Len/2nd Weight Sex Delivery Anes PTL Lv  4 Current           3 Term 10/18/17 3921w1d 07:31 / 00:44 3615 g M VBAC EPI  LIV  2 Term 06/16/15 2226w0d   F CS-Unspec   LIV     Complications: Breech presentation  1 TAB             Past Surgical History: Past Surgical History:  Procedure Laterality Date  . CESAREAN SECTION      Family History: No family history on  file.  Social History: Social History   Tobacco Use  . Smoking status: Never Smoker  . Smokeless tobacco: Never Used  Substance Use Topics  . Alcohol use: No    Frequency: Never  . Drug use: No    Allergies: No Known Allergies  Meds:  Medications Prior to Admission  Medication Sig Dispense Refill Last Dose  . Prenatal Vit-Fe Fumarate-FA (PRENATAL MULTIVITAMIN) TABS tablet Take 1 tablet by mouth daily at 12 noon.   08/20/2019 at Unknown time  . IRON PO Take 1 tablet by mouth daily. Pt not sure of dosage and not sure info for pharmacy filled     . PRESCRIPTION MEDICATION Inhale 1 puff into the lungs daily as needed. Pt uses an inhaler during allergy season and when she gets angry  (does not know the name )  It was prescribed in her home country [place].       I have reviewed patient's Past Medical Hx, Surgical Hx, Family Hx, Social Hx, medications and allergies.   ROS:  Review of Systems  Constitutional: Negative for chills and fever.  Eyes: Negative for blurred vision and visual disturbance.  Respiratory: Negative for shortness of breath.   Cardiovascular: Negative for chest pain and palpitations.  Gastrointestinal: Positive for abdominal pain. Negative for constipation, diarrhea and nausea.  Genitourinary: Positive for pelvic pain. Negative for  dysuria and vaginal bleeding.  Neurological: Negative for dizziness, weakness and headaches.   Other systems negative  Physical Exam   Patient Vitals for the past 24 hrs:  BP Temp Pulse Resp Weight  08/20/19 2018 (!) 132/93 98.2 F (36.8 C) 96 19 99.8 kg   08/20/19 2101  -  88  -  -  156/96  -  -  -  -  -  - CW   08/20/19 2043  -  101   -  -  151/105                 Vitals:   08/20/19 2201 08/20/19 2216 08/20/19 2231 08/20/19 2245  BP: 134/83 133/85 (!) 131/91   Pulse: 91 95 96   Resp:      Temp:    98.4 F (36.9 C)  TempSrc:    Oral  Weight:        Constitutional: Well-developed, well-nourished female in no acute  distress.  Cardiovascular: normal rate and rhythm Respiratory: normal effort, clear to auscultation bilaterally GI: Abd soft, non-tender, gravid appropriate for gestational age.   No rebound or guarding. MS: Extremities nontender, Trace edema, normal ROM Neurologic: Alert and oriented x 4.  DTRs 2+ with no clonus GU: Neg CVAT.  PELVIC EXAM:  Dilation: 3.5 Effacement (%): 50 Cervical Position: Posterior Station: -2 Presentation: Vertex Exam by:: Denyse Dago, RN  FHT:  Baseline 140 , moderate variability, accelerations present, no decelerations Contractions:  Irregular     Labs: Results for orders placed or performed during the hospital encounter of 08/20/19 (from the past 24 hour(s))  Urinalysis, Routine w reflex microscopic     Status: Abnormal   Collection Time: 08/20/19  9:01 PM  Result Value Ref Range   Color, Urine YELLOW YELLOW   APPearance HAZY (A) CLEAR   Specific Gravity, Urine 1.021 1.005 - 1.030   pH 6.0 5.0 - 8.0   Glucose, UA NEGATIVE NEGATIVE mg/dL   Hgb urine dipstick NEGATIVE NEGATIVE   Bilirubin Urine NEGATIVE NEGATIVE   Ketones, ur NEGATIVE NEGATIVE mg/dL   Protein, ur 30 (A) NEGATIVE mg/dL   Nitrite NEGATIVE NEGATIVE   Leukocytes,Ua TRACE (A) NEGATIVE   RBC / HPF 0-5 0 - 5 RBC/hpf   WBC, UA 0-5 0 - 5 WBC/hpf   Bacteria, UA NONE SEEN NONE SEEN   Squamous Epithelial / LPF 6-10 0 - 5   Mucus PRESENT   Protein / creatinine ratio, urine     Status: Abnormal   Collection Time: 08/20/19  9:01 PM  Result Value Ref Range   Creatinine, Urine 71.77 mg/dL   Total Protein, Urine 35 mg/dL   Protein Creatinine Ratio 0.49 (H) 0.00 - 0.15 mg/mg[Cre]  CBC     Status: Abnormal   Collection Time: 08/20/19  9:12 PM  Result Value Ref Range   WBC 7.0 4.0 - 10.5 K/uL   RBC 3.83 (L) 3.87 - 5.11 MIL/uL   Hemoglobin 12.3 12.0 - 15.0 g/dL   HCT 35.5 (L) 36.0 - 46.0 %   MCV 92.7 80.0 - 100.0 fL   MCH 32.1 26.0 - 34.0 pg   MCHC 34.6 30.0 - 36.0 g/dL   RDW 13.6 11.5 - 15.5  %   Platelets 269 150 - 400 K/uL   nRBC 0.0 0.0 - 0.2 %  Comprehensive metabolic panel     Status: Abnormal   Collection Time: 08/20/19  9:12 PM  Result Value Ref Range   Sodium 137 135 -  145 mmol/L   Potassium 3.8 3.5 - 5.1 mmol/L   Chloride 106 98 - 111 mmol/L   CO2 22 22 - 32 mmol/L   Glucose, Bld 98 70 - 99 mg/dL   BUN 10 6 - 20 mg/dL   Creatinine, Ser 9.16 (L) 0.44 - 1.00 mg/dL   Calcium 9.1 8.9 - 38.4 mg/dL   Total Protein 6.4 (L) 6.5 - 8.1 g/dL   Albumin 2.7 (L) 3.5 - 5.0 g/dL   AST 17 15 - 41 U/L   ALT 17 0 - 44 U/L   Alkaline Phosphatase 115 38 - 126 U/L   Total Bilirubin 0.3 0.3 - 1.2 mg/dL   GFR calc non Af Amer >60 >60 mL/min   GFR calc Af Amer >60 >60 mL/min   Anion gap 9 5 - 15     Imaging:  Bedside US done for presentation Vertex presentation noted   Pt informed that the ultrasound is considered a limited OB ultrasound and is not intended to be a complete ultrasound exam.  Patient also informed that the ultrasound is not being completed with the intent of assessing for fetal or placental anomalies or any pelvic abnormalities.  Explained that the purpose of today's ultrasound is to assess for presentation    Patient acknowledges the purpose of the exam and the limitations of the study.    MAU Course/MDM: I have ordered labs and reviewed results. Labs are normal except for elevated Protein/Creatinine Ratio NST reviewed, reactive with irregular contractions Consult Dr Despina Hidden with presentation, exam findings and test results. He thinks patient does need to be induced so we will bring her back in the morning for induction of labor Orders placed for admission and Pitocin IOL  Treatments in MAU included EFM, [place].    Assessment: Single Intrauterine pregnancy at [redacted]w[redacted]d Preeclampsia Undetermined prenatal care, no records GBS Neg (seen here earlier this week)  Plan: Discharge home Preeclampsia and Labor precautions and fetal kick counts Follow up in AM for Labor  admission  Encouraged to return here or to other Urgent Care/ED if she develops worsening of symptoms, increase in pain, fever, or other concerning symptoms.   Pt stable at time of discharge.  Wynelle Bourgeois CNM, MSN Certified Nurse-Midwife 08/20/2019 8:51 PM

## 2019-08-20 NOTE — Discharge Instructions (Signed)
Labor Induction ° °Labor induction is when steps are taken to cause a pregnant woman to begin the labor process. Most women go into labor on their own between 37 weeks and 42 weeks of pregnancy. When this does not happen or when there is a medical need for labor to begin, steps may be taken to induce labor. Labor induction causes a pregnant woman's uterus to contract. It also causes the cervix to soften (ripen), open (dilate), and thin out (efface). Usually, labor is not induced before 39 weeks of pregnancy unless there is a medical reason to do so. Your health care provider will determine if labor induction is needed. °Before inducing labor, your health care provider will consider a number of factors, including: °· Your medical condition and your baby's. °· How many weeks along you are in your pregnancy. °· How mature your baby's lungs are. °· The condition of your cervix. °· The position of your baby. °· The size of your birth canal. °What are some reasons for labor induction? °Labor may be induced if: °· Your health or your baby's health is at risk. °· Your pregnancy is overdue by 1 week or more. °· Your water breaks but labor does not start on its own. °· There is a low amount of amniotic fluid around your baby. °You may also choose (elect) to have labor induced at a certain time. Generally, elective labor induction is done no earlier than 39 weeks of pregnancy. °What methods are used for labor induction? °Methods used for labor induction include: °· Prostaglandin medicine. This medicine starts contractions and causes the cervix to dilate and ripen. It can be taken by mouth (orally) or by being inserted into the vagina (suppository). °· Inserting a small, thin tube (catheter) with a balloon into the vagina and then expanding the balloon with water to dilate the cervix. °· Stripping the membranes. In this method, your health care provider gently separates amniotic sac tissue from the cervix. This causes the  cervix to stretch, which in turn causes the release of a hormone called progesterone. The hormone causes the uterus to contract. This procedure is often done during an office visit, after which you will be sent home to wait for contractions to begin. °· Breaking the water. In this method, your health care provider uses a small instrument to make a small hole in the amniotic sac. This eventually causes the amniotic sac to break. Contractions should begin after a few hours. °· Medicine to trigger or strengthen contractions. This medicine is given through an IV that is inserted into a vein in your arm. °Except for membrane stripping, which can be done in a clinic, labor induction is done in the hospital so that you and your baby can be carefully monitored. °How long does it take for labor to be induced? °The length of time it takes to induce labor depends on how ready your body is for labor. Some inductions can take up to 2-3 days, while others may take less than a day. Induction may take longer if: °· You are induced early in your pregnancy. °· It is your first pregnancy. °· Your cervix is not ready. °What are some risks associated with labor induction? °Some risks associated with labor induction include: °· Changes in fetal heart rate, such as being too high, too low, or irregular (erratic). °· Failed induction. °· Infection in the mother or the baby. °· Increased risk of having a cesarean delivery. °· Fetal death. °· Breaking off (abruption)   of the placenta from the uterus (rare).  Rupture of the uterus (very rare). When induction is needed for medical reasons, the benefits of induction generally outweigh the risks. What are some reasons for not inducing labor? Labor induction should not be done if:  Your baby does not tolerate contractions.  You have had previous surgeries on your uterus, such as a myomectomy, removal of fibroids, or a vertical scar from a previous cesarean delivery.  Your placenta lies  very low in your uterus and blocks the opening of the cervix (placenta previa).  Your baby is not in a head-down position.  The umbilical cord drops down into the birth canal in front of the baby.  There are unusual circumstances, such as the baby being very early (premature).  You have had more than 2 previous cesarean deliveries. Summary  Labor induction is when steps are taken to cause a pregnant woman to begin the labor process.  Labor induction causes a pregnant woman's uterus to contract. It also causes the cervix to ripen, dilate, and efface.  Labor is not induced before 39 weeks of pregnancy unless there is a medical reason to do so.  When induction is needed for medical reasons, the benefits of induction generally outweigh the risks. This information is not intended to replace advice given to you by your health care provider. Make sure you discuss any questions you have with your health care provider. Document Released: 02/02/2007 Document Revised: 09/16/2017 Document Reviewed: 10/27/2016 Elsevier Patient Education  2020 [place].   Preeclampsia and Eclampsia Preeclampsia is a serious condition that may develop during pregnancy. This condition causes high blood pressure and increased protein in your urine along with other symptoms, such as headaches and vision changes. These symptoms may develop as the condition gets worse. Preeclampsia may occur at 20 weeks of pregnancy or later. Diagnosing and treating preeclampsia early is very important. If not treated early, it can cause serious problems for you and your baby. One problem it can lead to is eclampsia. Eclampsia is a condition that causes muscle jerking or shaking (convulsions or seizures) and other serious problems for the mother. During pregnancy, delivering your baby may be the best treatment for preeclampsia or eclampsia. For most women, preeclampsia and eclampsia symptoms go away after giving birth. In rare cases, a  woman may develop preeclampsia after giving birth (postpartum preeclampsia). This usually occurs within 48 hours after childbirth but may occur up to 6 weeks after giving birth. What are the causes? The cause of preeclampsia is not known. What increases the risk? The following risk factors make you more likely to develop preeclampsia:  Being pregnant for the first time.  Having had preeclampsia during a past pregnancy.  Having a family history of preeclampsia.  Having high blood pressure.  Being pregnant with more than one baby.  Being 19 or older.  Being African-American.  Having kidney disease or diabetes.  Having medical conditions such as lupus or blood diseases.  Being very overweight (obese). What are the signs or symptoms? The most common symptoms are:  Severe headaches.  Vision problems, such as blurred or double vision.  Abdominal pain, especially upper abdominal pain. Other symptoms that may develop as the condition gets worse include:  Sudden weight gain.  Sudden swelling of the hands, face, legs, and feet.  Severe nausea and vomiting.  Numbness in the face, arms, legs, and feet.  Dizziness.  Urinating less than usual.  Slurred speech.  Convulsions or seizures. How is  this diagnosed? There are no screening tests for preeclampsia. Your health care provider will ask you about symptoms and check for signs of preeclampsia during your prenatal visits. You may also have tests that include:  Checking your blood pressure.  Urine tests to check for protein. Your health care provider will check for this at every prenatal visit.  Blood tests.  Monitoring your baby's heart rate.  Ultrasound. How is this treated? You and your health care provider will determine the treatment approach that is best for you. Treatment may include:  Having more frequent prenatal exams to check for signs of preeclampsia, if you have an increased risk for  preeclampsia.  Medicine to lower your blood pressure.  Staying in the hospital, if your condition is severe. There, treatment will focus on controlling your blood pressure and the amount of fluids in your body (fluid retention).  Taking medicine (magnesium sulfate) to prevent seizures. This may be given as an injection or through an IV.  Taking a low-dose aspirin during your pregnancy.  Delivering your baby early. You may have your labor started with medicine (induced), or you may have a cesarean delivery. Follow these instructions at home: Eating and drinking   Drink enough fluid to keep your urine pale yellow.  Avoid caffeine. Lifestyle  Do not use any products that contain nicotine or tobacco, such as cigarettes and e-cigarettes. If you need help quitting, ask your health care provider.  Do not use alcohol or drugs.  Avoid stress as much as possible. Rest and get plenty of sleep. General instructions  Take over-the-counter and prescription medicines only as told by your health care provider.  When lying down, lie on your left side. This keeps pressure off your major blood vessels.  When sitting or lying down, raise (elevate) your feet. Try putting some pillows underneath your lower legs.  Exercise regularly. Ask your health care provider what kinds of exercise are best for you.  Keep all follow-up and prenatal visits as told by your health care provider. This is important. How is this prevented? There is no known way of preventing preeclampsia or eclampsia from developing. However, to lower your risk of complications and detect problems early:  Get regular prenatal care. Your health care provider may be able to diagnose and treat the condition early.  Maintain a healthy weight. Ask your health care provider for help managing weight gain during pregnancy.  Work with your health care provider to manage any long-term (chronic) health conditions you have, such as diabetes or  kidney problems.  You may have tests of your blood pressure and kidney function after giving birth.  Your health care provider may have you take low-dose aspirin during your next pregnancy. Contact a health care provider if:  You have symptoms that your health care provider told you may require more treatment or monitoring, such as: ? Headaches. ? Nausea or vomiting. ? Abdominal pain. ? Dizziness. ? Light-headedness. Get help right away if:  You have severe: ? Abdominal pain. ? Headaches that do not get better. ? Dizziness. ? Vision problems. ? Confusion. ? Nausea or vomiting.  You have any of the following: ? A seizure. ? Sudden, rapid weight gain. ? Sudden swelling in your hands, ankles, or face. ? Trouble moving any part of your body. ? Numbness in any part of your body. ? Trouble speaking. ? Abnormal bleeding.  You faint. Summary  Preeclampsia is a serious condition that may develop during pregnancy.  This condition causes  high blood pressure and increased protein in your urine along with other symptoms, such as headaches and vision changes.  Diagnosing and treating preeclampsia early is very important. If not treated early, it can cause serious problems for you and your baby.  Get help right away if you have symptoms that your health care provider told you to watch for. This information is not intended to replace advice given to you by your health care provider. Make sure you discuss any questions you have with your health care provider. Document Released: 09/10/2000 Document Revised: 05/16/2018 Document Reviewed: 04/19/2016 Elsevier Patient Education  2020 [employer]

## 2019-08-21 ENCOUNTER — Other Ambulatory Visit: Payer: Self-pay

## 2019-08-21 ENCOUNTER — Inpatient Hospital Stay (HOSPITAL_COMMUNITY): Payer: Self-pay | Admitting: Anesthesiology

## 2019-08-21 ENCOUNTER — Inpatient Hospital Stay (HOSPITAL_COMMUNITY)
Admission: AD | Admit: 2019-08-21 | Discharge: 2019-08-23 | DRG: 807 | Disposition: A | Discharge status: Home / Self Care | Payer: Self-pay | Attending: Family Medicine | Admitting: Family Medicine

## 2019-08-21 ENCOUNTER — Inpatient Hospital Stay (HOSPITAL_COMMUNITY): Payer: Self-pay

## 2019-08-21 ENCOUNTER — Encounter (HOSPITAL_COMMUNITY): Payer: Self-pay | Admitting: *Deleted

## 2019-08-21 DIAGNOSIS — Z3A39 39 weeks gestation of pregnancy: Secondary | ICD-10-CM

## 2019-08-21 DIAGNOSIS — Z20828 Contact with and (suspected) exposure to other viral communicable diseases: Secondary | ICD-10-CM | POA: Diagnosis present

## 2019-08-21 DIAGNOSIS — O1404 Mild to moderate pre-eclampsia, complicating childbirth: Secondary | ICD-10-CM

## 2019-08-21 DIAGNOSIS — Z3403 Encounter for supervision of normal first pregnancy, third trimester: Secondary | ICD-10-CM

## 2019-08-21 DIAGNOSIS — O34219 Maternal care for unspecified type scar from previous cesarean delivery: Secondary | ICD-10-CM | POA: Diagnosis present

## 2019-08-21 DIAGNOSIS — O9952 Diseases of the respiratory system complicating childbirth: Secondary | ICD-10-CM | POA: Diagnosis present

## 2019-08-21 DIAGNOSIS — J45909 Unspecified asthma, uncomplicated: Secondary | ICD-10-CM | POA: Diagnosis present

## 2019-08-21 DIAGNOSIS — O1490 Unspecified pre-eclampsia, unspecified trimester: Secondary | ICD-10-CM | POA: Diagnosis present

## 2019-08-21 LAB — TYPE AND SCREEN
ABO/RH(D): O POS
ABO/RH(D): O POS
Antibody Screen: NEGATIVE
Antibody Screen: NEGATIVE

## 2019-08-21 LAB — CBC
HCT: 39.4 % (ref 36.0–46.0)
HCT: 38.1 % (ref 36.0–46.0)
Hemoglobin: 13 g/dL (ref 12.0–15.0)
Hemoglobin: 12.7 g/dL (ref 12.0–15.0)
MCH: 31.6 pg (ref 26.0–34.0)
MCH: 31.4 pg (ref 26.0–34.0)
MCHC: 33 g/dL (ref 30.0–36.0)
MCHC: 33.3 g/dL (ref 30.0–36.0)
MCV: 95.9 fL (ref 80.0–100.0)
MCV: 94.3 fL (ref 80.0–100.0)
Platelets: 286 10*3/uL (ref 150–400)
Platelets: 267 10*3/uL (ref 150–400)
RBC: 4.11 MIL/uL (ref 3.87–5.11)
RBC: 4.04 MIL/uL (ref 3.87–5.11)
RDW: 13.7 % (ref 11.5–15.5)
RDW: 13.8 % (ref 11.5–15.5)
WBC: 7.8 10*3/uL (ref 4.0–10.5)
WBC: 9.4 10*3/uL (ref 4.0–10.5)
nRBC: 0 % (ref 0.0–0.2)
nRBC: 0 % (ref 0.0–0.2)

## 2019-08-21 LAB — HEPATITIS B SURFACE ANTIGEN: Hepatitis B Surface Ag: NONREACTIVE

## 2019-08-21 LAB — RPR: RPR Ser Ql: NONREACTIVE

## 2019-08-21 LAB — SARS CORONAVIRUS 2 (TAT 6-24 HRS): SARS Coronavirus 2: NEGATIVE

## 2019-08-21 MED ORDER — COCONUT OIL OIL: 1.0000 "application " | TOPICAL_OIL | Status: DC | PRN

## 2019-08-21 MED ORDER — LACTATED RINGERS IV SOLN: 500.0000 mL | INTRAVENOUS | Status: DC | PRN

## 2019-08-21 MED ORDER — LIDOCAINE HCL (PF) 1 % IJ SOLN: 30.0000 mL | INTRAMUSCULAR | Status: DC | PRN

## 2019-08-21 MED ORDER — DIPHENHYDRAMINE HCL 50 MG/ML IJ SOLN: 12.5000 mg | INTRAMUSCULAR | Status: DC | PRN

## 2019-08-21 MED ORDER — LACTATED RINGERS IV SOLN: 500.0000 mL | Freq: Once | INTRAVENOUS | Status: DC

## 2019-08-21 MED ORDER — FENTANYL CITRATE (PF) 100 MCG/2ML IJ SOLN
INTRAMUSCULAR | Status: AC
  Filled 2019-08-21: qty 2

## 2019-08-21 MED ORDER — OXYTOCIN 40 UNITS IN NORMAL SALINE INFUSION - SIMPLE MED
1.0000 m[IU]/min | INTRAVENOUS | Status: DC
  Administered 2019-08-21: 2 m[IU]/min via INTRAVENOUS
  Filled 2019-08-21: qty 1000

## 2019-08-21 MED ORDER — OXYTOCIN BOLUS FROM INFUSION
500.0000 mL | Freq: Once | INTRAVENOUS | Status: AC
  Administered 2019-08-21: 500 mL via INTRAVENOUS

## 2019-08-21 MED ORDER — FENTANYL-BUPIVACAINE-NACL 0.5-0.125-0.9 MG/250ML-% EP SOLN
12.0000 mL/h | EPIDURAL | Status: DC | PRN
  Filled 2019-08-21: qty 250

## 2019-08-21 MED ORDER — ONDANSETRON HCL 4 MG PO TABS: 4.0000 mg | ORAL_TABLET | ORAL | Status: DC | PRN

## 2019-08-21 MED ORDER — BENZOCAINE-MENTHOL 20-0.5 % EX AERO
1.0000 "application " | INHALATION_SPRAY | CUTANEOUS | Status: DC | PRN
  Administered 2019-08-21: 1 via TOPICAL
  Filled 2019-08-21: qty 56

## 2019-08-21 MED ORDER — ACETAMINOPHEN 325 MG PO TABS
650.0000 mg | ORAL_TABLET | ORAL | Status: DC | PRN
  Administered 2019-08-22: 650 mg via ORAL
  Filled 2019-08-21: qty 2

## 2019-08-21 MED ORDER — SOD CITRATE-CITRIC ACID 500-334 MG/5ML PO SOLN: 30.0000 mL | ORAL | Status: DC | PRN

## 2019-08-21 MED ORDER — TERBUTALINE SULFATE 1 MG/ML IJ SOLN: 0.2500 mg | Freq: Once | INTRAMUSCULAR | Status: DC | PRN

## 2019-08-21 MED ORDER — DIBUCAINE (PERIANAL) 1 % EX OINT: 1.0000 "application " | TOPICAL_OINTMENT | CUTANEOUS | Status: DC | PRN

## 2019-08-21 MED ORDER — OXYTOCIN 40 UNITS IN NORMAL SALINE INFUSION - SIMPLE MED: 2.5000 [IU]/h | INTRAVENOUS | Status: DC

## 2019-08-21 MED ORDER — SENNOSIDES-DOCUSATE SODIUM 8.6-50 MG PO TABS
2.0000 | ORAL_TABLET | ORAL | Status: DC
  Administered 2019-08-21: 2 via ORAL
  Filled 2019-08-21: qty 2

## 2019-08-21 MED ORDER — IBUPROFEN 600 MG PO TABS
600.0000 mg | ORAL_TABLET | Freq: Four times a day (QID) | ORAL | Status: DC
  Administered 2019-08-21 – 2019-08-22 (×5): 600 mg via ORAL
  Filled 2019-08-21 (×7): qty 1

## 2019-08-21 MED ORDER — LACTATED RINGERS IV SOLN
500.0000 mL | Freq: Once | INTRAVENOUS | Status: AC
  Administered 2019-08-21: 500 mL via INTRAVENOUS

## 2019-08-21 MED ORDER — PHENYLEPHRINE 40 MCG/ML (10ML) SYRINGE FOR IV PUSH (FOR BLOOD PRESSURE SUPPORT): 80.0000 ug | PREFILLED_SYRINGE | INTRAVENOUS | Status: DC | PRN

## 2019-08-21 MED ORDER — FENTANYL CITRATE (PF) 100 MCG/2ML IJ SOLN
INTRAMUSCULAR | Status: DC | PRN
  Administered 2019-08-21: 15 ug via INTRATHECAL

## 2019-08-21 MED ORDER — WITCH HAZEL-GLYCERIN EX PADS: 1.0000 "application " | MEDICATED_PAD | CUTANEOUS | Status: DC | PRN

## 2019-08-21 MED ORDER — SODIUM CHLORIDE (PF) 0.9 % IJ SOLN
INTRAMUSCULAR | Status: DC | PRN
  Administered 2019-08-21: 10 mL/h via EPIDURAL

## 2019-08-21 MED ORDER — PHENYLEPHRINE 40 MCG/ML (10ML) SYRINGE FOR IV PUSH (FOR BLOOD PRESSURE SUPPORT)
80.0000 ug | PREFILLED_SYRINGE | INTRAVENOUS | Status: DC | PRN
  Filled 2019-08-21: qty 10

## 2019-08-21 MED ORDER — OXYCODONE-ACETAMINOPHEN 5-325 MG PO TABS: 1.0000 | ORAL_TABLET | ORAL | Status: DC | PRN

## 2019-08-21 MED ORDER — SIMETHICONE 80 MG PO CHEW: 80.0000 mg | CHEWABLE_TABLET | ORAL | Status: DC | PRN

## 2019-08-21 MED ORDER — LACTATED RINGERS IV SOLN
INTRAVENOUS | Status: DC
  Administered 2019-08-21 (×2): via INTRAVENOUS

## 2019-08-21 MED ORDER — ACETAMINOPHEN 325 MG PO TABS: 650.0000 mg | ORAL_TABLET | ORAL | Status: DC | PRN

## 2019-08-21 MED ORDER — ONDANSETRON HCL 4 MG/2ML IJ SOLN: 4.0000 mg | INTRAMUSCULAR | Status: DC | PRN

## 2019-08-21 MED ORDER — EPHEDRINE 5 MG/ML INJ: 10.0000 mg | INTRAVENOUS | Status: DC | PRN

## 2019-08-21 MED ORDER — TETANUS-DIPHTH-ACELL PERTUSSIS 5-2.5-18.5 LF-MCG/0.5 IM SUSP: 0.5000 mL | Freq: Once | INTRAMUSCULAR | Status: DC

## 2019-08-21 MED ORDER — PRENATAL MULTIVITAMIN CH
1.0000 | ORAL_TABLET | Freq: Every day | ORAL | Status: DC
  Administered 2019-08-22: 1 via ORAL
  Filled 2019-08-21: qty 1

## 2019-08-21 MED ORDER — DIPHENHYDRAMINE HCL 25 MG PO CAPS: 25.0000 mg | ORAL_CAPSULE | Freq: Four times a day (QID) | ORAL | Status: DC | PRN

## 2019-08-21 MED ORDER — OXYCODONE-ACETAMINOPHEN 5-325 MG PO TABS: 2.0000 | ORAL_TABLET | ORAL | Status: DC | PRN

## 2019-08-21 MED ORDER — ONDANSETRON HCL 4 MG/2ML IJ SOLN: 4.0000 mg | Freq: Four times a day (QID) | INTRAMUSCULAR | Status: DC | PRN

## 2019-08-21 MED ORDER — BUPIVACAINE HCL (PF) 0.25 % IJ SOLN
INTRAMUSCULAR | Status: DC | PRN
  Administered 2019-08-21: .7 mL via INTRATHECAL

## 2019-08-21 NOTE — H&P (Addendum)
OBSTETRIC ADMISSION HISTORY AND PHYSICAL  Jacqueline Macdonald is a 28 y.o. female [phone] with IUP at [redacted]w[redacted]d presenting for IOL for PreEclampsia without severe features. She reports decreased FMs. She reports having small amount of brown discharge yesterday. Denies any, VB, blurry vision, headaches, peripheral edema, or RUQ pain. She plans on breastfeeding. She has not decided on a method of birth control.  Pt recently moved from [place] and had all prenatal care there.  No prenatal data available.  Dating: By unsure method  Estimated Date of Delivery: 08/25/19  Prenatal History/Complications: - gHTN and polyhydramnios in previous pregnancy - previous CS for breech then VBAC - language barrier - prenatal care in [place] - hx of Asthma- used inhaler last 1 yr ago - recently dx [facility]  Past Medical History: Past Medical History:  Diagnosis Date  . Asthma     Past Surgical History: Past Surgical History:  Procedure Laterality Date  . CESAREAN SECTION      Obstetrical History: OB History    Gravida  4   Para  2   Term  2   Preterm      AB  1   Living  2     SAB      TAB  1   Ectopic      Multiple  0   Live Births  2           Social History: Social History   Socioeconomic History  . Marital status: Married    Spouse name: Not on file  . Number of children: Not on file  . Years of education: Not on file  . Highest education level: Not on file  Occupational History  . Not on file  Social Needs  . Financial resource strain: Not on file  . Food insecurity    Worry: Not on file    Inability: Not on file  . Transportation needs    Medical: Not on file    Non-medical: Not on file  Tobacco Use  . Smoking status: Never Smoker  . Smokeless tobacco: Never Used  Substance and Sexual Activity  . Alcohol use: No    Frequency: Never  . Drug use: No  . Sexual activity: Yes  Lifestyle  . Physical activity    Days per week: Not on file    Minutes per session: Not on  file  . Stress: Not on file  Relationships  . Social Herbalist on phone: Not on file    Gets together: Not on file    Attends religious service: Not on file    Active member of club or organization: Not on file    Attends meetings of clubs or organizations: Not on file    Relationship status: Not on file  Other Topics Concern  . Not on file  Social History Narrative  . Not on file    Family History: Family History  Problem Relation Age of Onset  . Diabetes Mother   . Asthma Maternal Grandmother     Allergies: No Known Allergies  Medications Prior to Admission  Medication Sig Dispense Refill Last Dose  . Prenatal Vit-Fe Fumarate-FA (PRENATAL MULTIVITAMIN) TABS tablet Take 1 tablet by mouth daily at 12 noon.   08/20/2019 at Unknown time  . PRESCRIPTION MEDICATION Inhale 1 puff into the lungs daily as needed. Pt uses an inhaler during allergy season and when she gets angry  (does not know the name )  It was prescribed in  her home country [place].   More than a month at Unknown time     Review of Systems   All systems reviewed and negative except as stated in HPI  Blood pressure (!) 131/94, pulse 99, temperature 98.2 F (36.8 C), temperature source Oral, resp. rate 14, height 5' 2.99" (1.6 m), weight 100 kg, unknown if currently breastfeeding. General appearance: alert, cooperative and appears stated age Lungs: regular rate and effort Heart: regular rate  Abdomen: soft, non-tender Extremities: Homans sign is negative, no sign of DVT Presentation: cephalic Fetal monitoringBaseline: 140-150 bpm, Variability: Good {> 6 bpm) and Accelerations: Reactive Uterine activity minimal on Toco Dilation: 3 Effacement (%): 70 Station: -2 Exam by:: Fabian November, CNM  Prenatal labs: ABO, Rh: --/--/O POS (11/24 9147) Antibody: NEG (11/24 0837) Rubella:  pending RPR: NON REACTIVE (11/10 1839)  HBsAg:  pending HIV: NON REACTIVE (11/10 1839)  GBS:   neg 2 hr GTT no record    Prenatal Transfer Tool  Maternal Diabetes: No Genetic Screening: Unknown Maternal Ultrasounds/Referrals: Other: Unknown Fetal Ultrasounds or other Referrals:  None Maternal Substance Abuse:  No Significant Maternal Medications:  None Significant Maternal Lab Results: Group B Strep negative  Results for orders placed or performed during the hospital encounter of 08/21/19 (from the past 24 hour(s))  Type and screen [facility]   Collection Time: 08/20/19  9:12 PM  Result Value Ref Range   ABO/RH(D) O POS    Antibody Screen NEG    Sample Expiration 08/20/2019,2359   CBC   Collection Time: 08/21/19  8:37 AM  Result Value Ref Range   WBC 7.8 4.0 - 10.5 K/uL   RBC 4.11 3.87 - 5.11 MIL/uL   Hemoglobin 13.0 12.0 - 15.0 g/dL   HCT 82.9 56.2 - 13.0 %   MCV 95.9 80.0 - 100.0 fL   MCH 31.6 26.0 - 34.0 pg   MCHC 33.0 30.0 - 36.0 g/dL   RDW 86.5 78.4 - 69.6 %   Platelets 286 150 - 400 K/uL   nRBC 0.0 0.0 - 0.2 %  Type and screen [facility]   Collection Time: 08/21/19  8:37 AM  Result Value Ref Range   ABO/RH(D) O POS    Antibody Screen NEG    Sample Expiration      08/24/2019,2359 Performed at [facility], [address] [place], [place] [address]   Results for orders placed or performed during the hospital encounter of 08/20/19 (from the past 24 hour(s))  Urinalysis, Routine w reflex microscopic   Collection Time: 08/20/19  9:01 PM  Result Value Ref Range   Color, Urine YELLOW YELLOW   APPearance HAZY (A) CLEAR   Specific Gravity, Urine 1.021 1.005 - 1.030   pH 6.0 5.0 - 8.0   Glucose, UA NEGATIVE NEGATIVE mg/dL   Hgb urine dipstick NEGATIVE NEGATIVE   Bilirubin Urine NEGATIVE NEGATIVE   Ketones, ur NEGATIVE NEGATIVE mg/dL   Protein, ur 30 (A) NEGATIVE mg/dL   Nitrite NEGATIVE NEGATIVE   Leukocytes,Ua TRACE (A) NEGATIVE   RBC / HPF 0-5 0 - 5 RBC/hpf   WBC, UA 0-5 0 - 5 WBC/hpf   Bacteria, UA NONE SEEN NONE SEEN   Squamous  Epithelial / LPF 6-10 0 - 5   Mucus PRESENT   Protein / creatinine ratio, urine   Collection Time: 08/20/19  9:01 PM  Result Value Ref Range   Creatinine, Urine 71.77 mg/dL   Total Protein, Urine 35 mg/dL  Protein Creatinine Ratio 0.49 (H) 0.00 - 0.15 mg/mg[Cre]  CBC   Collection Time: 08/20/19  9:12 PM  Result Value Ref Range   WBC 7.0 4.0 - 10.5 K/uL   RBC 3.83 (L) 3.87 - 5.11 MIL/uL   Hemoglobin 12.3 12.0 - 15.0 g/dL   HCT 16.135.5 (L) 09.636.0 - 04.546.0 %   MCV 92.7 80.0 - 100.0 fL   MCH 32.1 26.0 - 34.0 pg   MCHC 34.6 30.0 - 36.0 g/dL   RDW 40.913.6 81.111.5 - 91.415.5 %   Platelets 269 150 - 400 K/uL   nRBC 0.0 0.0 - 0.2 %  Comprehensive metabolic panel   Collection Time: 08/20/19  9:12 PM  Result Value Ref Range   Sodium 137 135 - 145 mmol/L   Potassium 3.8 3.5 - 5.1 mmol/L   Chloride 106 98 - 111 mmol/L   CO2 22 22 - 32 mmol/L   Glucose, Bld 98 70 - 99 mg/dL   BUN 10 6 - 20 mg/dL   Creatinine, Ser 7.820.43 (L) 0.44 - 1.00 mg/dL   Calcium 9.1 8.9 - 95.610.3 mg/dL   Total Protein 6.4 (L) 6.5 - 8.1 g/dL   Albumin 2.7 (L) 3.5 - 5.0 g/dL   AST 17 15 - 41 U/L   ALT 17 0 - 44 U/L   Alkaline Phosphatase 115 38 - 126 U/L   Total Bilirubin 0.3 0.3 - 1.2 mg/dL   GFR calc non Af Amer >60 >60 mL/min   GFR calc Af Amer >60 >60 mL/min   Anion gap 9 5 - 15  SARS CORONAVIRUS 2 (TAT 6-24 HRS) Nasopharyngeal Nasopharyngeal Swab   Collection Time: 08/20/19 10:51 PM   Specimen: Nasopharyngeal Swab  Result Value Ref Range   SARS Coronavirus 2 NEGATIVE NEGATIVE    Patient Active Problem List   Diagnosis Date Noted  . Preeclampsia 08/21/2019  . Encounter for supervision of normal first pregnancy in third trimester 10/12/2017  . H/O: C-section 10/12/2017  . Language barrier 10/12/2017  . Asthma     Assessment: Jacqueline Macdonald is a 28 y.o. [id number] at 5847w3d here for IOL for PreEclampsia without severe features  1. Labor: Pitocin IOL 2. FWB: Cat I, moderate variability 3. Pain: IV meds, epidural if patient  requests 4. GBS: Negative 5. Preclampsia: stable   Plan: Admit L&D GC/Chlamydia Monitor BP Clear fluids FHR monitoring  Dana Allananya Walsh, MD  08/21/2019, 9:44 AM  Midwife attestation: I have seen and examined this patient; I agree with above documentation in the resident's note.   PE: Gen: calm comfortable, NAD Resp: normal effort and rate Abd: gravid  ROS, labs, PMH reviewed  Assessment/Plan: Jacqueline Macdonald is a 28 y.o. [id number] here for IOL for PEC Admit to LD Labor: latent Favorable cervix- Pitocin IOL FWB: Cat I GBS neg  Donette LarryMelanie Brain Honeycutt, CNM  08/21/2019, 10:39 AM

## 2019-08-21 NOTE — Progress Notes (Signed)
Video interpreter used for admission. Aileen Pilot #[id number]

## 2019-08-21 NOTE — Lactation Note (Signed)
This note was copied from a baby's chart. Lactation Consultation Note  Patient Name: Jacqueline Macdonald [id number] Date: 08/21/2019 Reason for consult: Term  P3 mother whose infant is now 46 hours old.  Mother breast fed her first child for 19 months and her second child for 15 months.  Mother's feeding preference on admission was breast/bottle.  [employer] interpreter used for interpretation.  Baby was asleep in mother's arms when I arrived.  She had recently finished breast feeding.  Mother had no questions/concerns related to breast feeding.  Encouraged to feed 8-12 times/24 hours or sooner if baby shows feeding cues.  Reviewed cues.  Mother did not wish to review hand expression.  Colostrum container provided and milk storage times reviewed.  Finger feeding demonstrated.   Encouraged to call for latch assistance as needed.  Mother asked for coconut oil and a manual pump.  Mother's breasts are soft and non tender and nipples are everted and intact.  I provided the coconut oil and manual pump with instructions for use.  Mother did not want me to demonstrate the pump; stated she knew how to use it.    Mom made aware of O/P services, breastfeeding support groups, community resources, and our phone # for post-discharge questions. Mother has a DEBP for home use.  No support person present at this time.  Mother will call as needed.   Maternal Data Formula Feeding for Exclusion: Yes Reason for exclusion: Mother's choice to formula and breast feed on admission Has patient been taught Hand Expression?: Yes Does the patient have breastfeeding experience prior to this delivery?: Yes  Feeding    LATCH Score                   Interventions    Lactation Tools Discussed/Used     Consult Status Consult Status: Follow-up Date: 08/22/19 Follow-up type: In-patient    Ronnica Dreese R Viana Sleep 08/21/2019, 10:12 PM

## 2019-08-21 NOTE — Anesthesia Procedure Notes (Signed)
Epidural Patient location during procedure: OB Start time: 08/21/2019 11:00 AM End time: 08/21/2019 11:03 AM  Staffing Anesthesiologist: Brennan Bailey, MD Performed: anesthesiologist   Preanesthetic Checklist Completed: patient identified, pre-op evaluation, timeout performed, IV checked, risks and benefits discussed and monitors and equipment checked  Epidural Patient position: sitting Prep: site prepped and draped and DuraPrep Patient monitoring: heart rate, continuous pulse ox and blood pressure Approach: midline Location: L3-L4 Injection technique: LOR air  Needle:  Needle type: Tuohy  Needle gauge: 17 G Needle length: 9 cm Needle insertion depth: 6 cm Catheter type: closed end flexible Catheter size: 19 Gauge Catheter at skin depth: 11 cm  Assessment Events: blood not aspirated, injection not painful, no injection resistance, negative IV test and no paresthesia  Additional Notes Patient identified. Risks, benefits, and alternatives discussed with patient including but not limited to bleeding, infection, nerve damage, paralysis, failed block, incomplete pain control, headache, blood pressure changes, nausea, vomiting, reactions to medication, itching, and postpartum back pain. Confirmed with bedside nurse the patient's most recent platelet count. Confirmed with patient that they are not currently taking any anticoagulation, have any bleeding history, or any family history of bleeding disorders. Patient expressed understanding and wished to proceed. All questions were answered. Sterile technique was used throughout the entire procedure. Please see nursing notes for vital signs.   Crisp LOR with Tuohy needle on first pass. Whitacre 25g 170mm spinal needle introduced through Tuohy with clear CSF return prior to injection of intrathecal medication. Spinal needle withdrawn and epidural catheter threaded easily. Negative aspiration of catheter for heme or CSF prior to starting  epidural infusion. Warning signs of high block given to the patient including shortness of breath, tingling/numbness in hands, complete motor block, or any concerning symptoms with instructions to call for help. Patient was given instructions on fall risk and not to get out of bed. All questions and concerns addressed with instructions to call with any issues or inadequate analgesia.  Patient comfortable with contractions prior to my leaving room. Reason for block:procedure for pain

## 2019-08-21 NOTE — Progress Notes (Signed)
Video Interpreter Najwa [phone] used to discuss VBAC consent and plan for induction of labor.

## 2019-08-21 NOTE — Progress Notes (Signed)
Video interpreter Ronette Deter [phone] used for delivery and perineal repair.

## 2019-08-21 NOTE — Discharge Summary (Signed)
Postpartum Discharge Summary    Patient Name: Jacqueline Macdonald DOB: [birth year] MRN: [id number]  Date of admission: 08/21/2019 Delivering Provider: Carollee Leitz   Date of discharge: 08/23/2019  Admitting diagnosis: Induction Intrauterine pregnancy: [redacted]w[redacted]d    Secondary diagnosis:  Active Problems:   Preeclampsia   VBAC (vaginal birth after Cesarean)   311weeks gestation of pregnancy  Additional problems: language barrier     Discharge diagnosis: VBAC and Preeclampsia (mild)                                                                                                Post partum procedures:None  Augmentation: Pitocin  Complications: None  Hospital course:  Induction of Labor With Vaginal Delivery   28y.o. yo [id number] 350w3das admitted to the hospital 08/21/2019 for induction of labor.  Indication for induction: Preeclampsia.  Patient had an uncomplicated labor course as follows: Membrane Rupture Time/Date: 2:22 PM ,08/21/2019   Intrapartum Procedures: Episiotomy: None [1]                                         Lacerations:  2nd degree [3];Perineal [11]  Patient had delivery of a Viable infant.  Information for the patient's newborn:  AlDenetria, Luevanos0[[id number]]Delivery Method: VBAC, Spontaneous(Filed from Delivery Summary)    08/21/2019  Details of delivery can be found in separate delivery note. [facility] [place]. Amlodipine 2.5 mg started on day of discharge due to pre-E and SBP's consistently in 130's. [facility] Declined birth control. Patient is discharged home 08/23/19. Delivery time: 3:18 PM    Magnesium Sulfate received: No BMZ received: No Rhophylac:N/A MMR: ordered Transfusion:No  Physical exam  Vitals:   08/22/19 0500 08/22/19 1346 08/22/19 2020 08/23/19 0511  BP: 130/81 132/89 136/78 133/89  Pulse: 93 81 (!) 101 92  Resp: '16 17 18 18  ' Temp: 98.1 F (36.7 C) 98.5 F (36.9 C)  97.9 F (36.6 C) 98 F (36.7 C)  TempSrc: Oral Oral Oral Oral  SpO2: 99%  100% 100%  Weight:      Height:       General: alert, cooperative and no distress Lochia: appropriate Uterine Fundus: firm DVT Evaluation: No evidence of DVT seen on physical exam. Labs: Lab Results  Component Value Date   WBC 9.4 08/21/2019   HGB 12.7 08/21/2019   HCT 38.1 08/21/2019   MCV 94.3 08/21/2019   PLT 267 08/21/2019   CMP Latest Ref Rng & Units 08/20/2019  Glucose 70 - 99 mg/dL 98  BUN 6 - 20 mg/dL 10  Creatinine 0.44 - 1.00 mg/dL 0.43(L)  Sodium 135 - 145 mmol/L 137  Potassium 3.5 - 5.1 mmol/L 3.8  Chloride 98 - 111 mmol/L 106  CO2 22 - 32 mmol/L 22  Calcium 8.9 - 10.3 mg/dL 9.1  Total Protein 6.5 - 8.1 g/dL 6.4(L)  Total Bilirubin 0.3 - 1.2 mg/dL 0.3  Alkaline Phos 38 - 126 U/L 115  AST 15 - 41 U/L 17  ALT 0 - 44 U/L 17    Discharge instruction: per After Visit Summary and "Baby and Me Booklet".  After visit meds:  Allergies as of 08/23/2019   No Known Allergies     Medication List    STOP taking these medications   PRESCRIPTION MEDICATION     TAKE these medications   acetaminophen 325 MG tablet Commonly known as: Tylenol Take 2 tablets (650 mg total) by mouth every 6 (six) hours as needed (for pain scale < 4).   amLODipine 2.5 MG tablet Commonly known as: NORVASC Take 1 tablet (2.5 mg total) by mouth daily.   ibuprofen 600 MG tablet Commonly known as: ADVIL Take 1 tablet (600 mg total) by mouth every 6 (six) hours as needed.   prenatal multivitamin Tabs tablet Take 1 tablet by mouth daily at 12 noon.       Diet: routine diet  Activity: Advance as tolerated. Pelvic rest for 6 weeks.   Outpatient follow up:4 weeks Follow up Appt: Future Appointments  Date Time Provider Talent  09/05/2019  9:00 AM Norcross Bowers  10/01/2019  3:55 PM Nehemiah Settle, Tanna Savoy, DO [facility]   Follow up Visit: [place] for [facility]. Go in 1 week(s).   Specialty: Obstetrics and Gynecology Why: for blood pressure check Contact information: [address] [place], [place] [id number] [place] [address] [phone]          Please schedule this patient for Postpartum visit in: 4 weeks with the following provider: Any provider For C/S patients schedule nurse incision check in weeks 2 weeks: no High risk pregnancy complicated by: HTN Delivery mode:  VBAC Anticipated Birth Control:  other/unsure PP Procedures needed: BP check  Schedule Integrated [place] visit: no  Newborn Data: Live born female  Birth Weight: 3230g  APGAR: 9, 9  Newborn Delivery   Birth date/time: 08/21/2019 15:18:00 Delivery type: VBAC, Spontaneous      Baby Feeding: Breast Disposition:rooming in   08/23/2019 Chauncey Mann, MD

## 2019-08-21 NOTE — Progress Notes (Signed)
Labor Progress Note Jacqueline Macdonald is a 28 y.o. [id number] at [redacted]w[redacted]d presented for IOL for PEC  S:  Feeling painful ctx, s/p epidural.   O:  BP 111/67   Pulse 91   Temp 98.9 F (37.2 C) (Oral)   Resp 14   Ht 5' 2.99" (1.6 m)   Wt 100 kg   BMI 39.06 kg/m  EFM: baseline 150 bpm/ mod variability/ + accels/ no decels  Toco/IUPC: 2-3 SVE: Dilation: 6 Effacement (%): 80, 90 Station: -1 Presentation: Vertex Exam by:: Ignacia Felling, RN Pitocin: 12 mu/min  A/P: 28 y.o. Q3R0076 [redacted]w[redacted]d  1. Labor: early active 2. FWB: Cat I 3. Pain: epidural 4. PEC- stable  Will redose epidural. Anticipate labor progression and VBAC.  Julianne Handler, CNM 2:46 PM

## 2019-08-21 NOTE — Anesthesia Preprocedure Evaluation (Addendum)
Anesthesia Evaluation  Patient identified by MRN, date of birth, ID band Patient awake    Reviewed: Allergy & Precautions, Patient's Chart, lab work & pertinent test results  History of Anesthesia Complications Negative for: history of anesthetic complications  Airway Mallampati: II  TM Distance: >3 FB Neck ROM: Full    Dental no notable dental hx.    Pulmonary asthma ,    Pulmonary exam normal        Cardiovascular negative cardio ROS Normal cardiovascular exam     Neuro/Psych negative neurological ROS  negative psych ROS   GI/Hepatic negative GI ROS, Neg liver ROS,   Endo/Other  negative endocrine ROS  Renal/GU negative Renal ROS  negative genitourinary   Musculoskeletal negative musculoskeletal ROS (+)   Abdominal   Peds  Hematology negative hematology ROS (+)   Anesthesia Other Findings Day of surgery medications reviewed with patient.  Reproductive/Obstetrics (+) Pregnancy (hx of C/S x1, preE)                            Anesthesia Physical Anesthesia Plan  ASA: II  Anesthesia Plan: Epidural   Post-op Pain Management:    Induction:   PONV Risk Score and Plan: Treatment may vary due to age or medical condition  Airway Management Planned: Natural Airway  Additional Equipment:   Intra-op Plan:   Post-operative Plan:   Informed Consent: I have reviewed the patients History and Physical, chart, labs and discussed the procedure including the risks, benefits and alternatives for the proposed anesthesia with the patient or authorized representative who has indicated his/her understanding and acceptance.       Plan Discussed with:   Anesthesia Plan Comments:         Anesthesia Quick Evaluation

## 2019-08-22 LAB — GC/CHLAMYDIA PROBE AMP (~~LOC~~) NOT AT ARMC
Chlamydia: NEGATIVE
Comment: NEGATIVE
Comment: NORMAL
Neisseria Gonorrhea: NEGATIVE

## 2019-08-22 LAB — RUBELLA SCREEN: Rubella: 12.1 index (ref >0.99)

## 2019-08-22 NOTE — Progress Notes (Addendum)
POSTPARTUM PROGRESS NOTE  Subjective: Jacqueline Macdonald is a 28 y.o. U2P5361 s/p Vaginal Delivery at [redacted]w[redacted]d.  She reports she doing well. No acute events overnight. She denies any problems with ambulating, voiding or po intake. Denies nausea or vomiting. She has  passed flatus. Pain is well controlled.  Lochia is appropriate.  Objective: Blood pressure 130/81, pulse 93, temperature 98.1 F (36.7 C), temperature source Oral, resp. rate 16, height 5' 2.99" (1.6 m), weight 100 kg, SpO2 99 %, unknown if currently breastfeeding.  Physical Exam:  General: alert, cooperative and no distress Chest: no respiratory distress Abdomen: soft, non-tender  Uterine Fundus: firm, appropriately tender Extremities: No calf swelling or tenderness  no edema  Recent Labs    08/21/19 0837 08/21/19 1656  HGB 13.0 12.7  HCT 39.4 38.1    Assessment/Plan: Jacqueline Macdonald is a 28 y.o. W4R1540 s/p Vaginal Delivery at [redacted]w[redacted]d for PEC.  Routine Postpartum Care: Doing well, pain well-controlled.  -- Continue routine care, lactation support  -- Contraception: undecided -- Feeding: breast  Dispo: Plan for discharge tomorrow.  Carollee Leitz MD [place]   OB FELLOW POSTPARTUM PROGRESS NOTE ATTESTATION  I have seen and examined this patient and agree with above documentation in the resident's note.   Phill Myron, D.O. OB Fellow  08/22/2019, 11:27 AM

## 2019-08-22 NOTE — Clinical Social Work Maternal (Signed)
CLINICAL SOCIAL WORK MATERNAL/CHILD NOTE  Patient Details  Name: Jacqueline Macdonald MRN: [id number] Date of Birth: 09/09/1991  Date:  02-Jul-2019  Clinical Social Worker Initiating Note:  Durward Fortes, LCSW Date/Time: Initiated:  08/22/19/0930     Child's Name:  Jacqueline Macdonald   Biological Parents:  Mother   Need for Interpreter:  Arabic   Reason for Referral:  Late or No Prenatal Care    Address:  [place] [address]    Phone number:  [phone] (home)     Additional phone number: none   Household Members/Support Persons (HM/SP):   Household Member/Support Person 2, Household Member/Support Person 1   HM/SP Name Relationship DOB or Age  HM/SP -1 Jacqueline Macdonald MOB  02/13/1991  HM/SP -2 Salma Basem  daughter   4  HM/SP -3   Basem Elsherbini  FOB   unknown   HM/SP -4   Omar Basem  son   1  HM/SP -5        HM/SP -6        HM/SP -7        HM/SP -8          Natural Supports (not living in the home):  Friends   Professional Supports: None   Employment: Unemployed   Type of Work: one   Education:  Forensic psychologist   Homebound arranged:  n/a  Museum/gallery curator Resources:  Self-Pay    Other Resources:    none reported.   Cultural/Religious Considerations Which May Impact Care:  none reported.   Strengths:  Ability to meet basic needs , Compliance with medical plan , Home prepared for child    Psychotropic Medications:   none reported.       Pediatrician:     not chosen yet.   Pediatrician List:   [facility]      Pediatrician Fax Number:    Risk Factors/Current Problems:  None   Cognitive State:  Alert , Able to Concentrate , Insightful    Mood/Affect:  Bright , Calm , Relaxed , Interested , Happy    CSW Assessment: CSW consulted as MOB recently moved from [place] to the [place] and [facility] records unable to be located at this time. CSW  went to speak with MOB at bedside to address further needs. CSW used interpreter Amal [phone] to speak with  MOB in Arabic. CSW entered room and congratulated MOB on the birth of infant Verne). CSW advised MOB of CSW role and the reason for CSW coming to visit with her. MOB was very pleasant. MOB smiled and greeted CSW and reported that it was fine for CSW to come and speak with her at this time. MOB reported to CSW that she did get [facility] in [place], however she only had two visit once arrived here. MOB reported that she is aware that records for her care are unable to be located at this time. CSW informed MOB of the hospital drug screen policy regarding PNC. MOB was advised that there are two drug screens performed on infant, CDS and UDS. MOB was informed that infants UDS is negative, however CSW would need to follow CDS. MOB was advised that if CDS is positive then CSW would need to make CPS report for substances. MOB reported that she understood and that she only took and antibiotics  for her teeth and PNV. Mob denied all other substances at this time. CSW assessed MOB for safety in which MOB reported she just moved here from [place] 6 weeks ago with her spouse and children. MOB reported that she has enjoyed living here so far.    CSW inquired from MOB on her mental health. 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 MOB reported that she has no other mental health diagnosis that she is aware of. MOB reported that she was never placed on medications for her PPD and reported that she didn't even go to a doctor for it. CSW took this time to educate MOB On PPD and SIDS. MOB reported that she understood and expressed no other need. MOB reported that her supports are her spouse and some of his friends here. MOB reported that she has all needed items to care for infant with no needs.    CSW will continue to monitor infants CDS for CPS report if warranted.   CSW  Plan/Description:  No Further Intervention Required/No Barriers to Discharge, Sudden Infant Death Syndrome (SIDS) Education, Perinatal Mood and Anxiety Disorder (PMADs) Education, Hospital Drug Screen Policy Information, CSW Will Continue to Monitor Umbilical Cord Tissue Drug Screen Results and Make Report if Warranted    Julianny Milstein S Marciana Uplinger, LCSWA 08/22/2019, 10:38 AM 

## 2019-08-22 NOTE — Lactation Note (Signed)
This note was copied from a baby's chart. Lactation Consultation Note  Patient Name: Jacqueline Macdonald EUMPN'T Date: 08/22/2019 Reason for consult: Follow-up assessment;Term;Hyperbilirubinemia;Infant weight loss  Therapist, nutritional for Arabic, Interpreter: Adil #[id number]  25 hour old FTI born to a P3 MOB, sitting on bench when [place] entered room. Baby was put on phototherapy in the morning. MOB reported not having any colostrum, baby not latching, and pain in the scale of 10 out of 10.  Mom voiced she had nipple pain and some cramping while BF.   LC assisted with latch and positioning on left breast in the cross-cradle position. Upon suck assessment, noticed that baby was biting; possible tongue restriction; she'll ask baby's pediatrician tomorrow. MOB confirmed biting while infant is latched. MOB reports having cracked and sore nipples when feeding older children, for 7 months and 15 months respectively. She stated that the pain was worse with second child.   MOB had her own Evenflo single electric pump from home. LC offered to set up a DEBP but she declined. Eldridge educated on benefit of using DEBP. Instruction on providing drops of EBM from flanges during finger feed to avoid wasting, provided.  Lactation student provided education on hunger cues, satiety cues, appropriate supplemental guidelines in accordance with American Academy of Pediatrics, role of breastmilk in reducing bilirubin levels, storage guidelines, and paced bottle feeding as well as the role of a slow-flow nipple. MOB was able to demonstrate proficiency in paced-bottle feeding, while providing 20 mL of Similac 20 calorie formula.   Feeding Plan  1. Encouraged mom to feed baby STS 8-12 times/24 hours or sooner if feeding cues are present 2. Offer any amount of breastmilk first prior offering any formula 2. If it's too painful to latch, she'll pump instead.  3. Pump every 3 hours   MOB reported all questions and  concerns were addressed and mom knows to call for lactation support PRN.   Maternal Data    Feeding Feeding Type: Breast Fed  LATCH Score Latch: Repeated attempts needed to sustain latch, nipple held in mouth throughout feeding, stimulation needed to elicit sucking reflex.  Audible Swallowing: None  Type of Nipple: Everted at rest and after stimulation  Comfort (Breast/Nipple): Filling, red/small blisters or bruises, mild/mod discomfort(baby is biting, not sucking)  Hold (Positioning): No assistance needed to correctly position infant at breast.  LATCH Score: 6  Interventions Interventions: Breast feeding basics reviewed;Assisted with latch;Skin to skin;Hand express;Support pillows;Adjust position;Breast compression  Lactation Tools Discussed/Used Tools: Pump Breast pump type: Other (comment)(Single electric breast pump) Pump Review: Setup, frequency, and cleaning;Milk Storage Initiated by:: Self Date initiated:: 08/23/19   Consult Status Consult Status: Follow-up Date: 08/23/19 Follow-up type: In-patient    Lamonda Noxon Francene Boyers 08/22/2019, 8:27 PM

## 2019-08-22 NOTE — Anesthesia Postprocedure Evaluation (Signed)
Anesthesia Post Note  Patient: Jacqueline Macdonald  Procedure(s) Performed: AN AD Hall     Patient location during evaluation: Mother Baby Anesthesia Type: Epidural Level of consciousness: awake, awake and alert and oriented Pain management: pain level controlled Vital Signs Assessment: post-procedure vital signs reviewed and stable Respiratory status: spontaneous breathing and respiratory function stable Cardiovascular status: blood pressure returned to baseline Postop Assessment: no headache, epidural receding, patient able to bend at knees, adequate PO intake, no backache, no apparent nausea or vomiting and able to ambulate Anesthetic complications: no    Last Vitals:  Vitals:   08/21/19 2341 08/22/19 0500  BP: 129/86 130/81  Pulse: 100 93  Resp: 17 16  Temp: 36.9 C 36.7 C  SpO2: 100% 99%    Last Pain:  Vitals:   08/22/19 0500  TempSrc: Oral  PainSc: 5    Pain Goal:                   Jacqueline Macdonald

## 2019-08-23 DIAGNOSIS — Z3A39 39 weeks gestation of pregnancy: Secondary | ICD-10-CM

## 2019-08-23 MED ORDER — ACETAMINOPHEN 325 MG PO TABS: 650.0000 mg | ORAL_TABLET | Freq: Four times a day (QID) | ORAL | 0 refills | Status: AC | PRN

## 2019-08-23 MED ORDER — AMLODIPINE BESYLATE 5 MG PO TABS
2.5000 mg | ORAL_TABLET | Freq: Every day | ORAL | Status: DC
  Administered 2019-08-23: 2.5 mg via ORAL
  Filled 2019-08-23: qty 1

## 2019-08-23 MED ORDER — IBUPROFEN 600 MG PO TABS: 600.0000 mg | ORAL_TABLET | Freq: Four times a day (QID) | ORAL | 0 refills | Status: AC | PRN

## 2019-08-23 MED ORDER — AMLODIPINE BESYLATE 2.5 MG PO TABS: 2.5000 mg | ORAL_TABLET | Freq: Every day | ORAL | 0 refills | Status: AC

## 2019-08-23 NOTE — Lactation Note (Signed)
This note was copied from a baby's chart. Lactation Consultation Note  Patient Name: Jacqueline Macdonald VVOHY'[id number] Date: 08/23/2019 Reason for consult: Nipple pain/trauma Baby is 48 hours/4% weight loss.  Video interpreter used for consult.  Mom states breastfeeding is not going well.  She is having difficulty with positioning and severe nipple pain.  Discussed milk coming to volume and the prevention and treatment of engorgement.  Mom would like assist with feeding.  Both nipples cracked and abraded.  Mom has been using cradle hold.  Baby also receiving formula supplementation.  Breasts are becoming full.  Nipples large.  Assisted with positioning baby in football hold.  Instructed mom on hand placement and breast compression.  Baby opens wide and latches easily but mom very uncomfortable.  24 mm nipple shield applied to possibly help with discomfort.  Baby latched well but mom continues to c/o a lot of pain.  Explained to mom that she may need to rest and heal nipples by pumping and bottle feeding for 24 hours.  Mom has mini electric even flo pump.  Symphony pump set up and initiated.  30 mm flanges used.  Mom pumped for 20 minutes and obtained 10 mls of transitional milk.  Baby took expressed milk prior to formula supplementation.  Comfort gels given with instructions.  Support and encouragement given to mom.  Maternal Data    Feeding Feeding Type: Breast Fed  LATCH Score Latch: Grasps breast easily, tongue down, lips flanged, rhythmical sucking.  Audible Swallowing: A few with stimulation  Type of Nipple: Everted at rest and after stimulation  Comfort (Breast/Nipple): Engorged, cracked, bleeding, large blisters, severe discomfort  Hold (Positioning): Assistance needed to correctly position infant at breast and maintain latch.  LATCH Score: 6  Interventions Interventions: Adjust position;DEBP;Assisted with latch;Support pillows;Skin to skin;Position options;Breast massage;Hand express;Comfort  gels  Lactation Tools Discussed/Used Tools: Nipple Shields Nipple shield size: 24 Pump Review: Setup, frequency, and cleaning;Milk Storage Initiated by:: [email or web address] Date initiated:: 08/23/19   Consult Status Consult Status: Complete Follow-up type: Call as needed    Ave Filter 08/23/2019, 3:53 PM

## 2019-09-05 ENCOUNTER — Ambulatory Visit: Payer: Self-pay

## 2019-10-01 ENCOUNTER — Encounter: Payer: Self-pay | Admitting: Family Medicine

## 2019-10-01 ENCOUNTER — Telehealth (INDEPENDENT_AMBULATORY_CARE_PROVIDER_SITE_OTHER): Payer: Self-pay | Admitting: Family Medicine

## 2019-10-01 NOTE — Progress Notes (Signed)
Number is unavailable cannot reach patient
